# Patient Record
Sex: Female | Born: 1975 | Race: White | Hispanic: No | Marital: Married | Smoking: Never smoker
Health system: Southern US, Community
[De-identification: ages and names within clinical notes are randomized; demographics above are authoritative.]

---

## 1997-09-30 ENCOUNTER — Ambulatory Visit (HOSPITAL_COMMUNITY): Admission: RE | Admit: 1997-09-30 | Discharge: 1997-09-30 | Payer: Self-pay | Admitting: Obstetrics

## 1997-09-30 ENCOUNTER — Other Ambulatory Visit: Admission: RE | Admit: 1997-09-30 | Discharge: 1997-09-30 | Payer: Self-pay | Admitting: Obstetrics

## 1997-12-24 ENCOUNTER — Inpatient Hospital Stay (HOSPITAL_COMMUNITY): Admission: AD | Admit: 1997-12-24 | Discharge: 1997-12-24 | Payer: Self-pay | Admitting: Obstetrics

## 1998-01-06 ENCOUNTER — Ambulatory Visit (HOSPITAL_COMMUNITY): Admission: RE | Admit: 1998-01-06 | Discharge: 1998-01-06 | Payer: Self-pay | Admitting: Obstetrics

## 1998-04-01 ENCOUNTER — Inpatient Hospital Stay (HOSPITAL_COMMUNITY): Admission: AD | Admit: 1998-04-01 | Discharge: 1998-04-01 | Payer: Self-pay | Admitting: Obstetrics

## 1998-04-10 ENCOUNTER — Inpatient Hospital Stay (HOSPITAL_COMMUNITY): Admission: AD | Admit: 1998-04-10 | Discharge: 1998-04-12 | Payer: Self-pay | Admitting: Obstetrics

## 2005-01-16 ENCOUNTER — Emergency Department (HOSPITAL_COMMUNITY): Admission: EM | Admit: 2005-01-16 | Discharge: 2005-01-16 | Payer: Self-pay | Admitting: Emergency Medicine

## 2005-03-17 ENCOUNTER — Encounter: Payer: Self-pay | Admitting: Obstetrics and Gynecology

## 2005-03-18 ENCOUNTER — Emergency Department (HOSPITAL_COMMUNITY): Admission: EM | Admit: 2005-03-18 | Discharge: 2005-03-19 | Payer: Self-pay | Admitting: Emergency Medicine

## 2005-03-22 ENCOUNTER — Inpatient Hospital Stay (HOSPITAL_COMMUNITY): Admission: AD | Admit: 2005-03-22 | Discharge: 2005-03-22 | Payer: Self-pay | Admitting: *Deleted

## 2005-05-24 ENCOUNTER — Ambulatory Visit (HOSPITAL_COMMUNITY): Admission: RE | Admit: 2005-05-24 | Discharge: 2005-05-24 | Payer: Self-pay | Admitting: Obstetrics

## 2005-08-07 ENCOUNTER — Inpatient Hospital Stay (HOSPITAL_COMMUNITY): Admission: AD | Admit: 2005-08-07 | Discharge: 2005-08-08 | Payer: Self-pay | Admitting: Obstetrics

## 2005-08-08 ENCOUNTER — Inpatient Hospital Stay (HOSPITAL_COMMUNITY): Admission: AD | Admit: 2005-08-08 | Discharge: 2005-08-08 | Payer: Self-pay | Admitting: Obstetrics & Gynecology

## 2005-08-31 ENCOUNTER — Inpatient Hospital Stay (HOSPITAL_COMMUNITY): Admission: AD | Admit: 2005-08-31 | Discharge: 2005-08-31 | Payer: Self-pay | Admitting: Obstetrics & Gynecology

## 2005-09-13 ENCOUNTER — Observation Stay (HOSPITAL_COMMUNITY): Admission: AD | Admit: 2005-09-13 | Discharge: 2005-09-14 | Payer: Self-pay | Admitting: Obstetrics

## 2005-09-28 ENCOUNTER — Inpatient Hospital Stay (HOSPITAL_COMMUNITY): Admission: AD | Admit: 2005-09-28 | Discharge: 2005-09-29 | Payer: Self-pay | Admitting: Obstetrics

## 2005-10-06 ENCOUNTER — Inpatient Hospital Stay (HOSPITAL_COMMUNITY): Admission: AD | Admit: 2005-10-06 | Discharge: 2005-10-10 | Payer: Self-pay | Admitting: Obstetrics

## 2008-02-25 IMAGING — US US OB FOLLOW-UP
1 series · 13 of 28 positions shown · non-contrast
Comparison: none

CLINICAL DATA: 29 weeks pregnant.  Preterm labor.

[Series 1: us ob follow-up · 0.39mm/px · 13 of 40 slices shown]
[im 2/40]
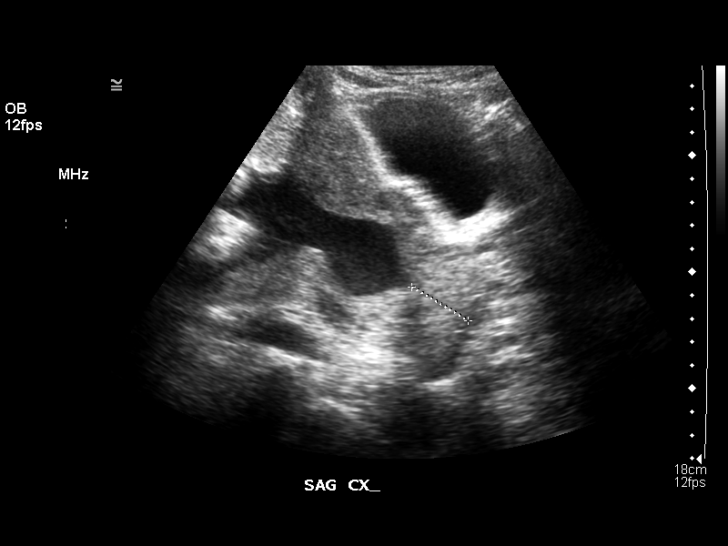
[im 5/40]
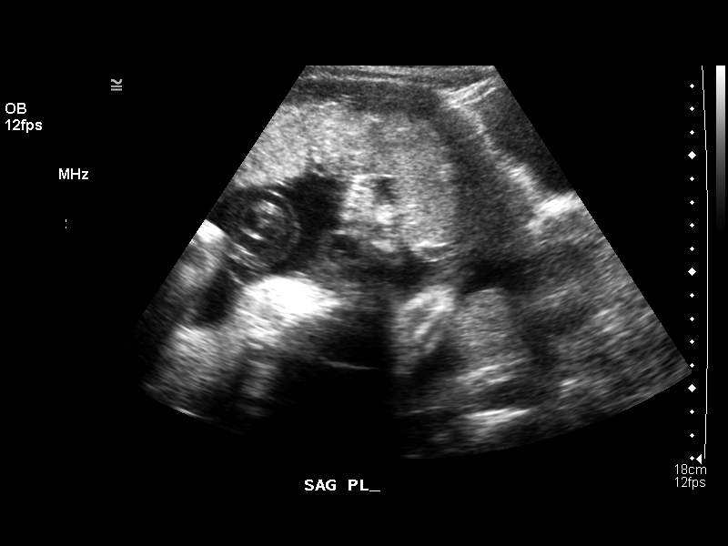
[im 8/40]
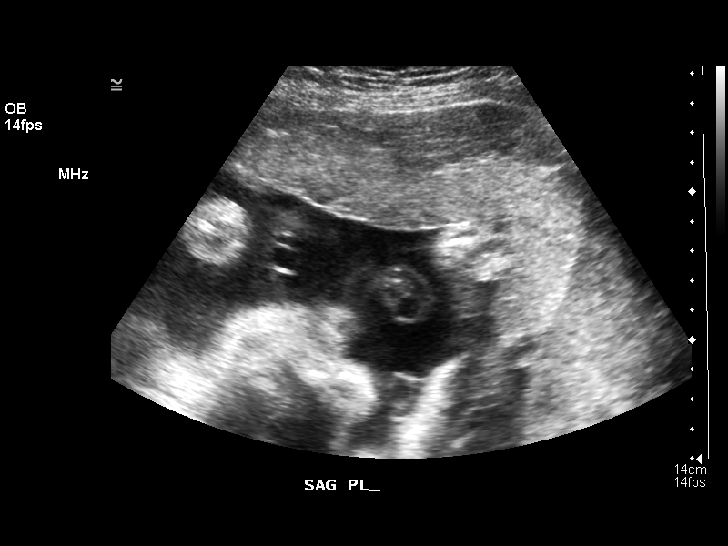
[im 11/40]
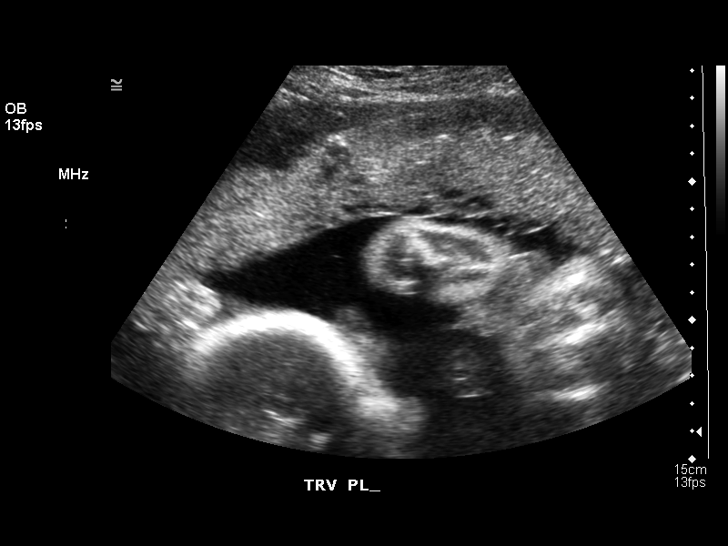
[im 14/40]
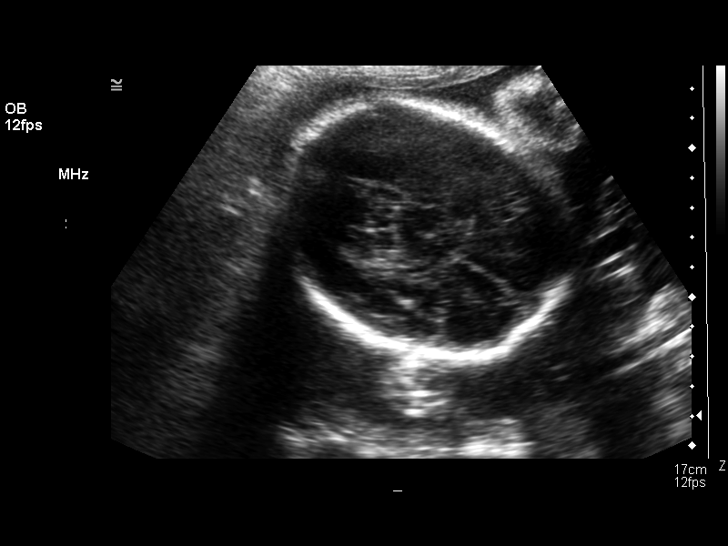
[im 16/40]
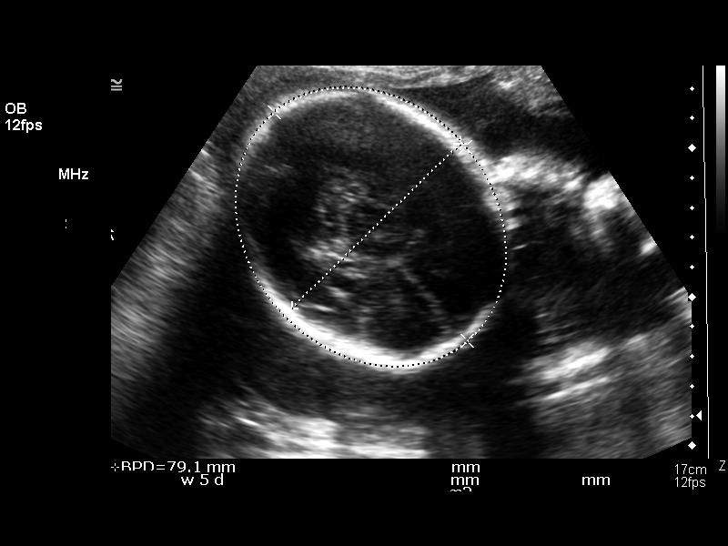
[im 21/40]
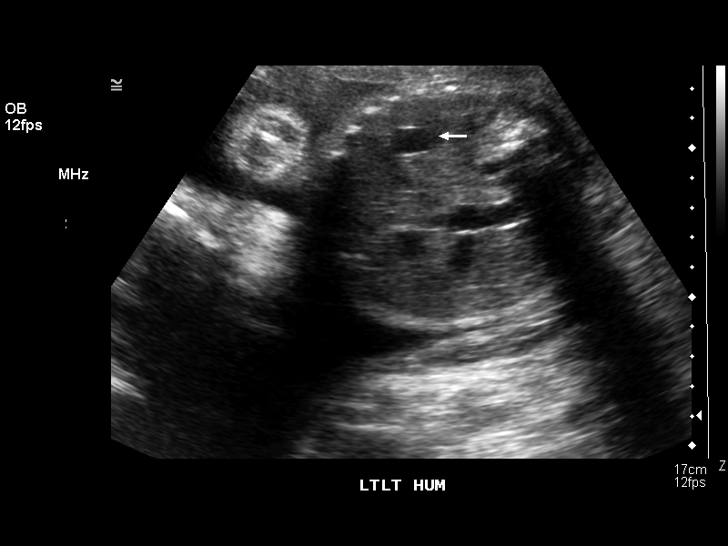
[im 24/40]
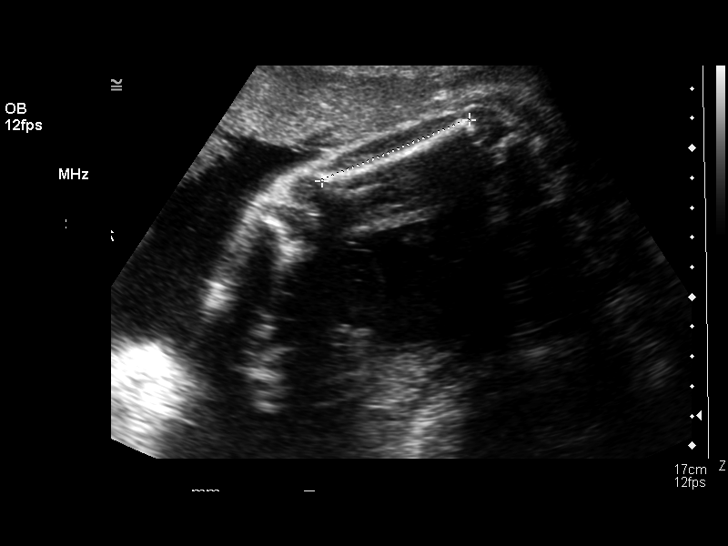
[im 27/40]
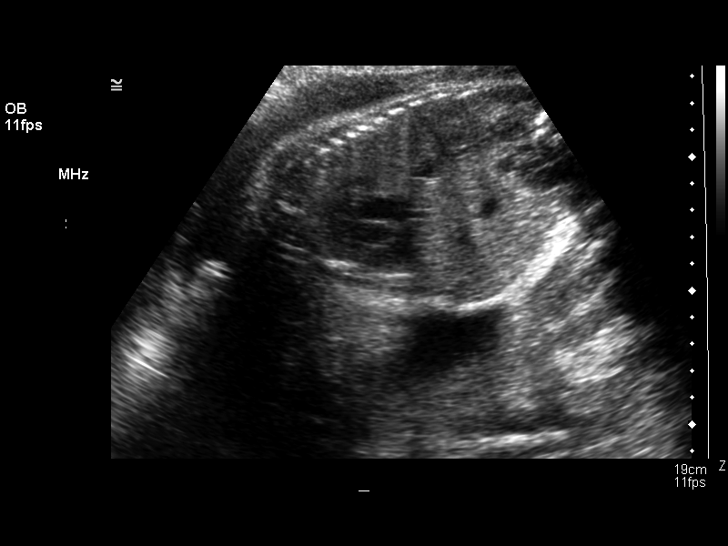
[im 29/40]
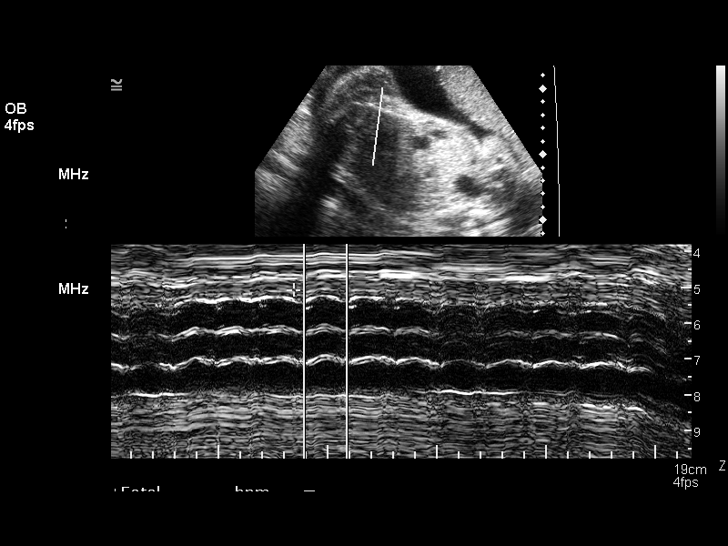
[im 32/40]
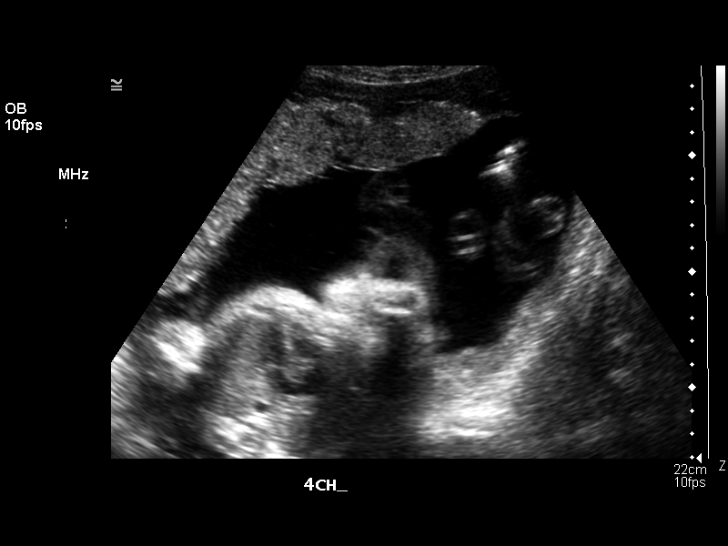
[im 35/40]
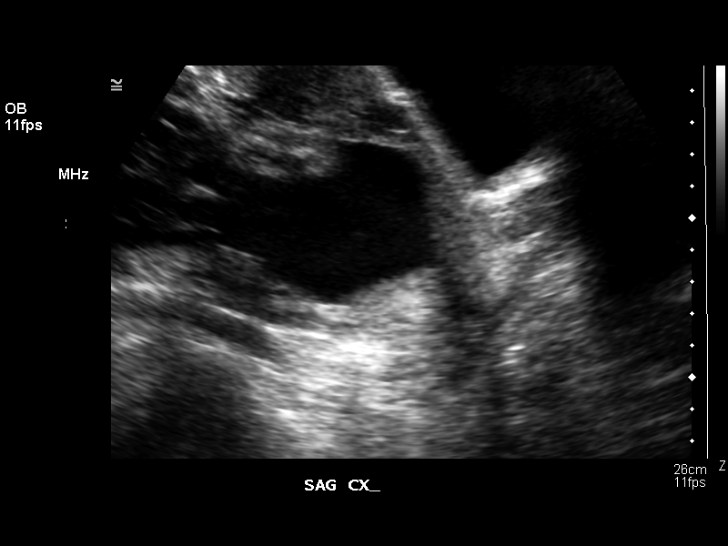
[im 38/40]
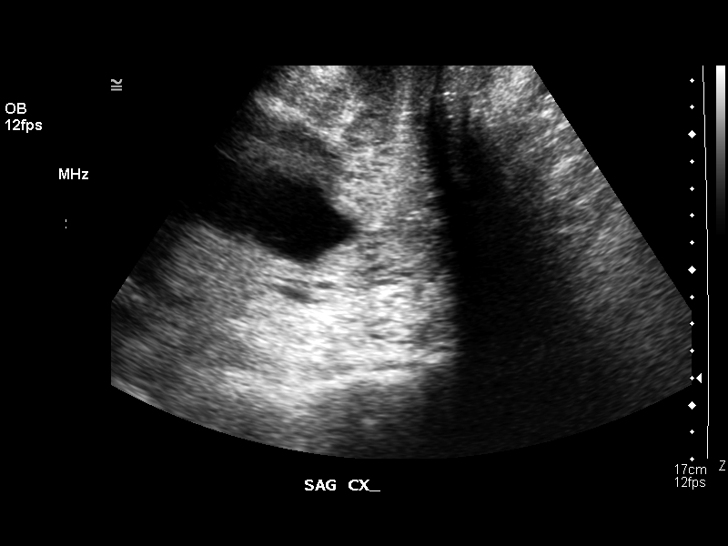

[13 of 28 positions shown; findings below may reference images not displayed]

OBSTETRICAL ULTRASOUND RE-EVALUATION:
 Number of Fetuses: 1
 Heart Rate: 153
 Movement:  Yes
 Breathing:  No
 Presentation:  Oblique/breech
 Placental Location:  Anterior
 Grade:  I
 Previa:  No
 Amniotic Fluid (subjective):  Normal
 Amniotic Fluid (objective):  6.1 cm Vertical pocket 

 FETAL BIOMETRY
 BPD:  7.9 cm  31 w 4 d
 HC:  28.8 cm  31 w 4 d
 AC:  26.8 cm  30 w 6 d
 FL:   5.5 cm  29 w 0 d 

 Mean GA:  30 w 5 d  US EDC:  10/11/05
 Assigned GA:  29 w 2 d  Assigned EDC:  10/21/05

 EFW:  3613 g (H) 90th – 95th%ile (6141 – 9887 g) For 29 wks 

 FETAL ANATOMY
 Lateral Ventricles:  Visualized 
 Thalami/CSP:  Visualized 
 Posterior Fossa:  Previously seen 
 Nuchal Region:  Previously seen 
 Spine:  Previously seen 
 4 Chamber Heart on Left:  Visualized 
 Stomach on Left:  Visualized 
 3 Vessel Cord:  Visualized 
 Cord Insertion Site:  Visualized 
 Kidneys:  Visualized 
 Bladder:  Visualized 
 Extremities:  Previously seen 

 ADDITIONAL ANATOMY VISUALIZED:  Diaphragm and male genitalia.

 Evaluation limited by: Fetal position and advanced gestational age.

 MATERNAL UTERINE AND ADNEXAL FINDINGS
 Cervix: 2.8 cm translabially
IMPRESSION: 1.  Single intrauterine pregnancy.  Fetal heart rate 153 bpm.  
 2.  Amniotic fluid subjectively and objectively within normal limits.
 3.  Estimated gestational age is currently 30 weeks 5 days.  Gestational age by first ultrasound would be 29 weeks 2 days.  
 4.  Cervix 2.8 cm, closed.

## 2008-04-02 IMAGING — US US OB LIMITED
1 series · 14 of 21 positions shown · non-contrast
Comparison: none

CLINICAL DATA: 35 weeks pregnant, contractions.

[Series 1: us ob limited · 0.39mm/px · 14 of 21 slices shown]
[im 1/21]
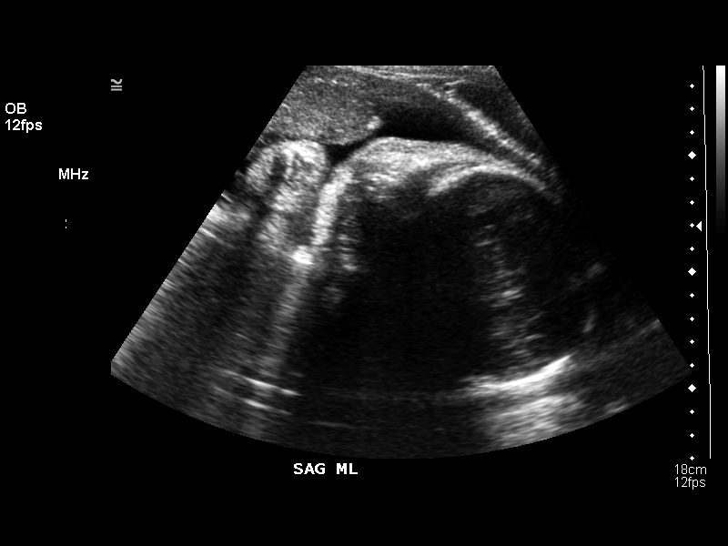
[im 3/21]
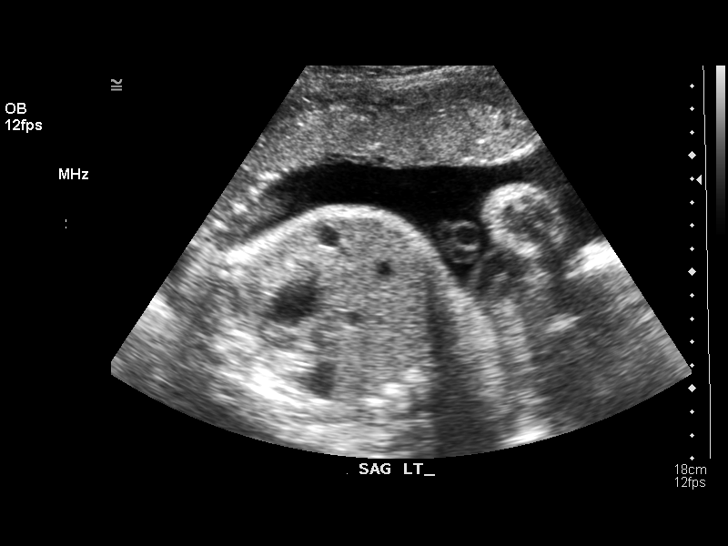
[im 4/21]
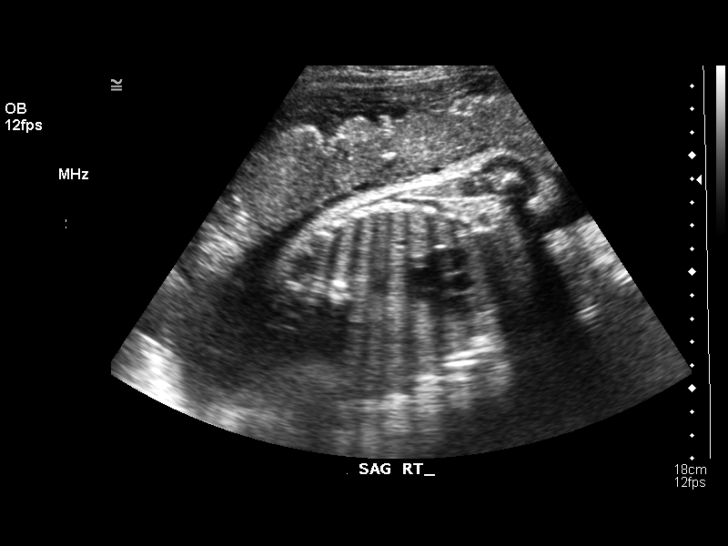
[im 6/21]
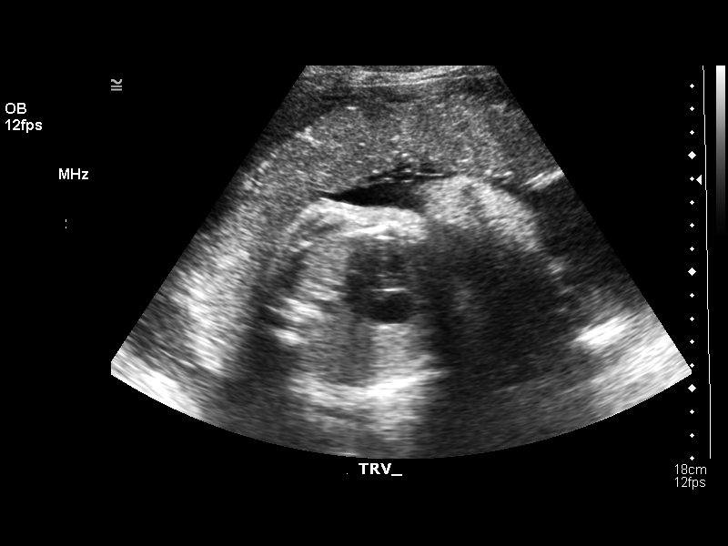
[im 7/21]
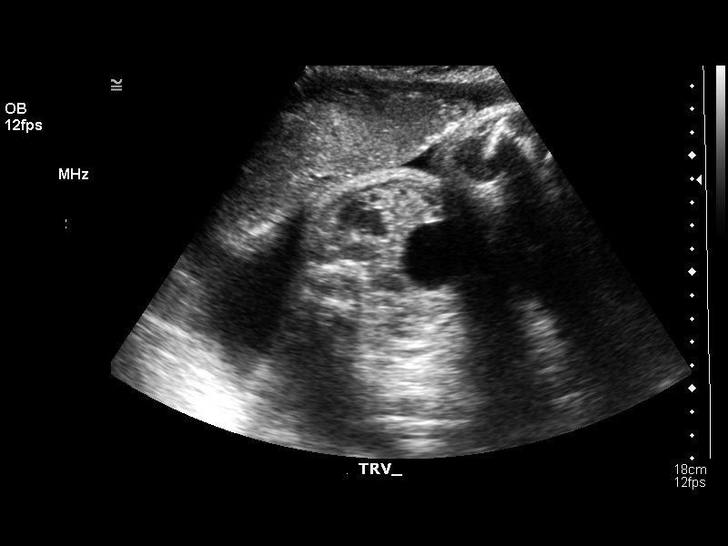
[im 9/21]
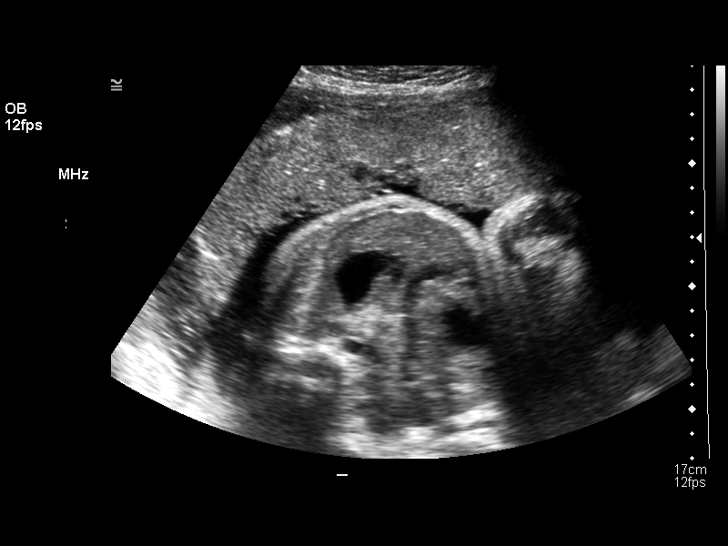
[im 10/21]
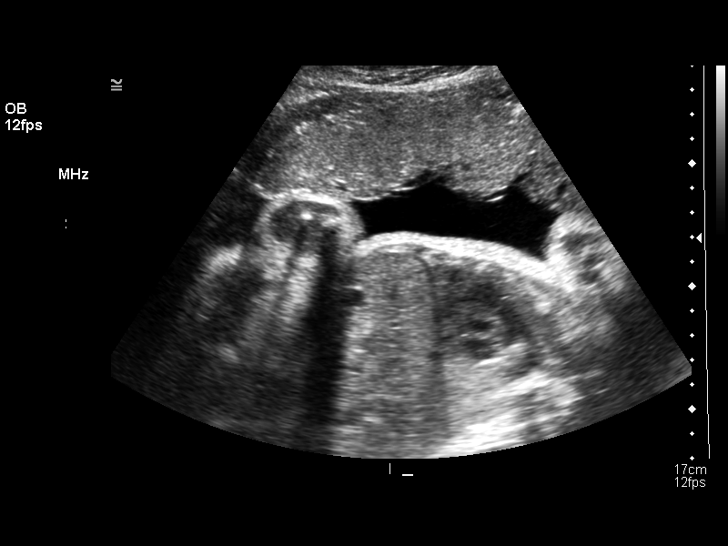
[im 12/21]
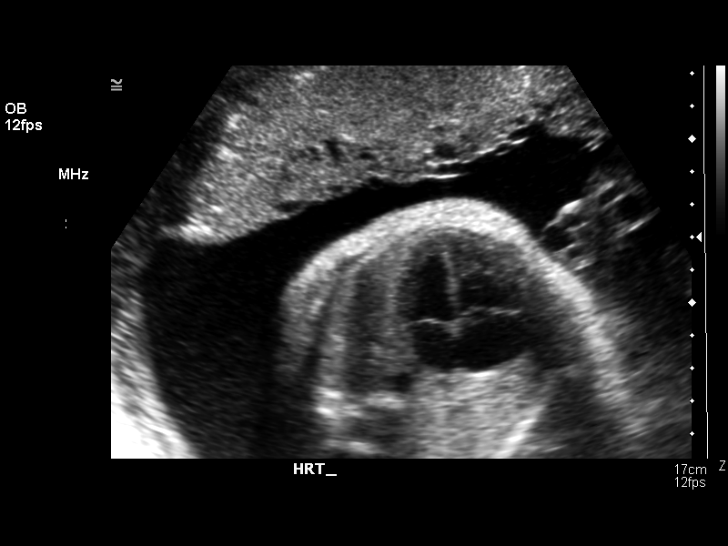
[im 13/21]
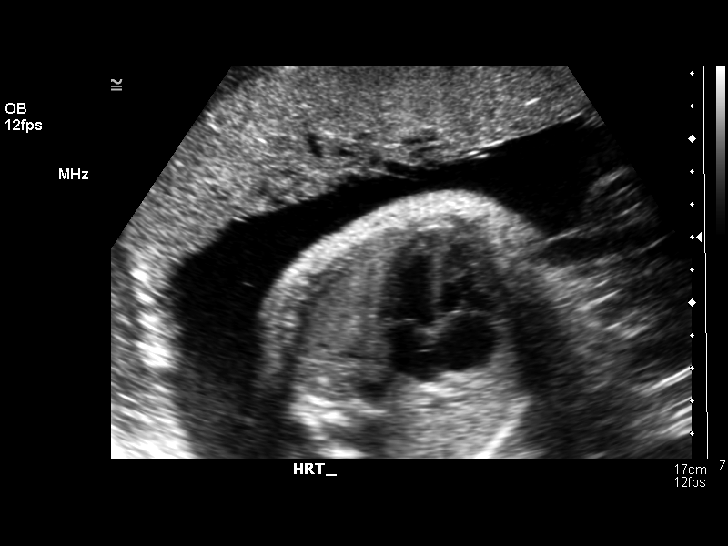
[im 15/21]
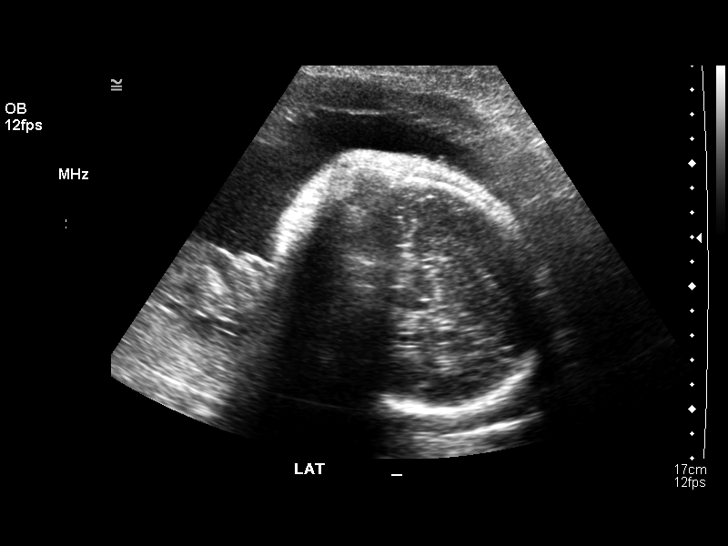
[im 16/21]
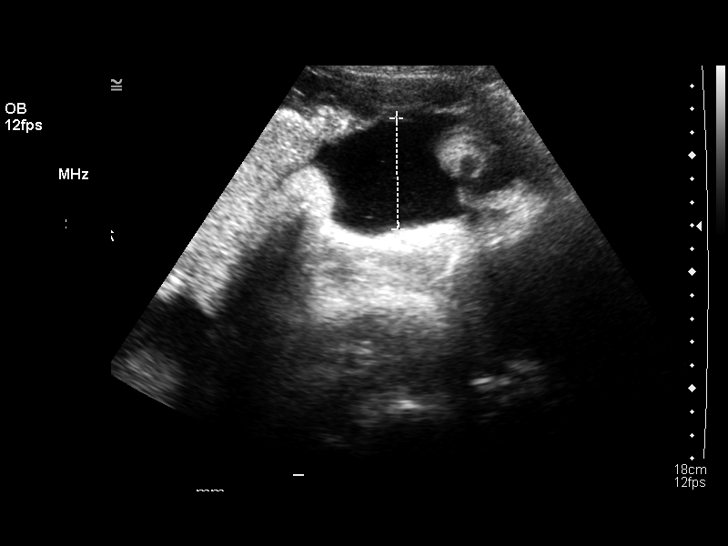
[im 18/21]
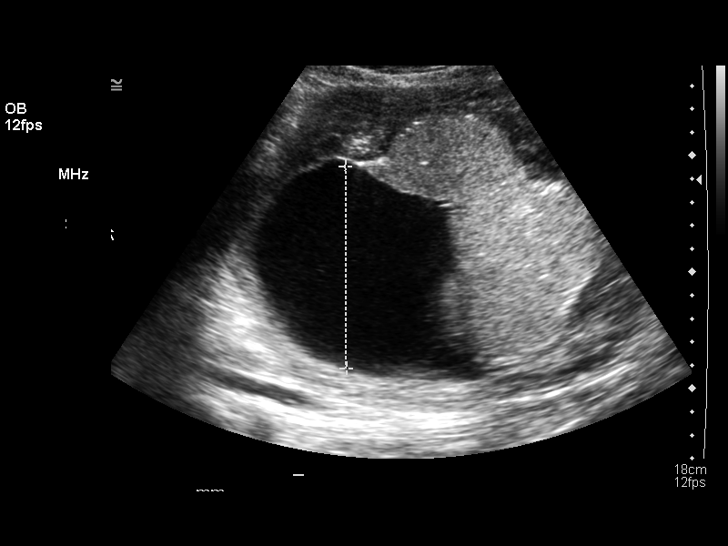
[im 19/21]
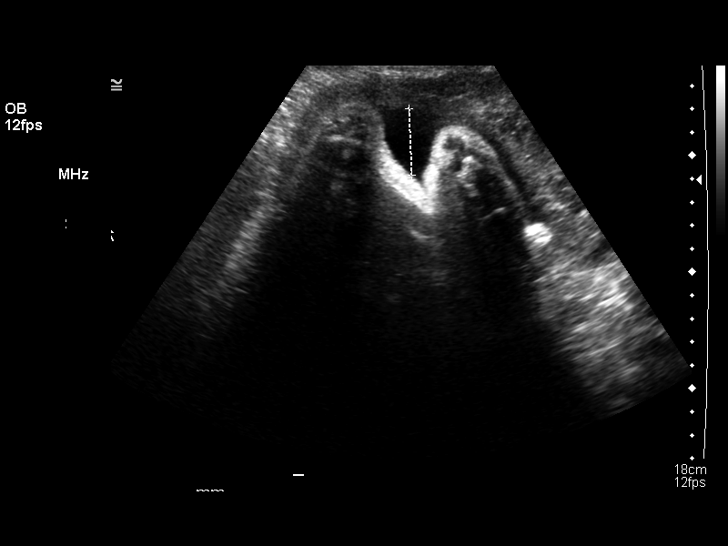
[im 21/21]
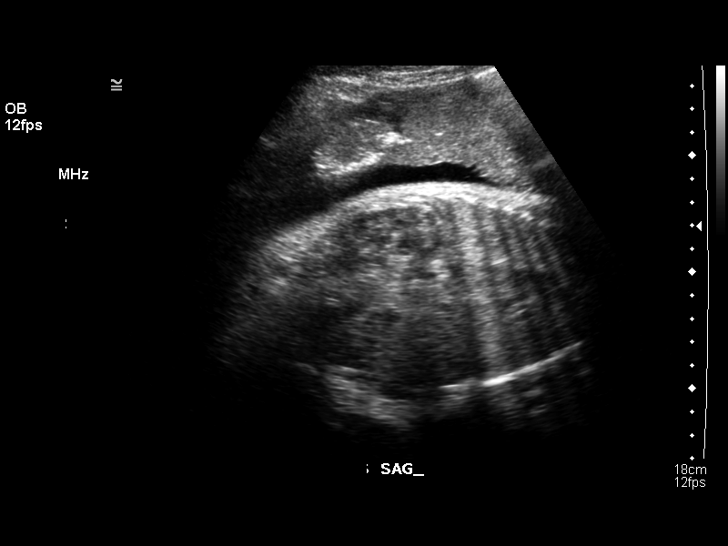

[14 of 21 positions shown; findings below may reference images not displayed]

LIMITED OBSTETRICAL ULTRASOUND:
 Number of Fetuses: 1
 Heart Rate:  150 bpm
 Movement:  Yes
 Breathing:  Yes
 Presentation:  Cephalic
 Placental Location:  Anterior
 Grade:  II
 Previa:  No
 Amniotic Fluid (Subjective):  Normal
 Amniotic Fluid (Objective):  AFI 21.2 cm (6th-96th %ile = 7.9 to 24.9 cm for 35 weeks) 

 Fetal measurements and complete anatomic evaluation were not requested.  The following fetal anatomy was visualized during this exam:  Lateral ventricles, 4-chamber heart, stomach, kidneys, bladder, and diaphragm.

 BIOPHYSICAL PROFILE:

 Movement:    2  Time:  15 minutes
 Breathing:  2
 Tone:  2
 Amniotic Fluid:  2

 Total Score:  8

 MATERNAL UTERINE AND ADNEXAL FINDINGS
 Cervix:  Not evaluated.
IMPRESSION: Single live intrauterine gestation in cephalic presentation with BPP [DATE].

## 2008-04-26 IMAGING — CR DG CHEST 1V PORT
1 series · 1 of 1 positions shown · non-contrast
Comparison: none

CLINICAL DATA: 38 weeks post partum C-section.  Rule out aspiration.
 PORTABLE CHEST - 1 VIEW:

[view not recorded]
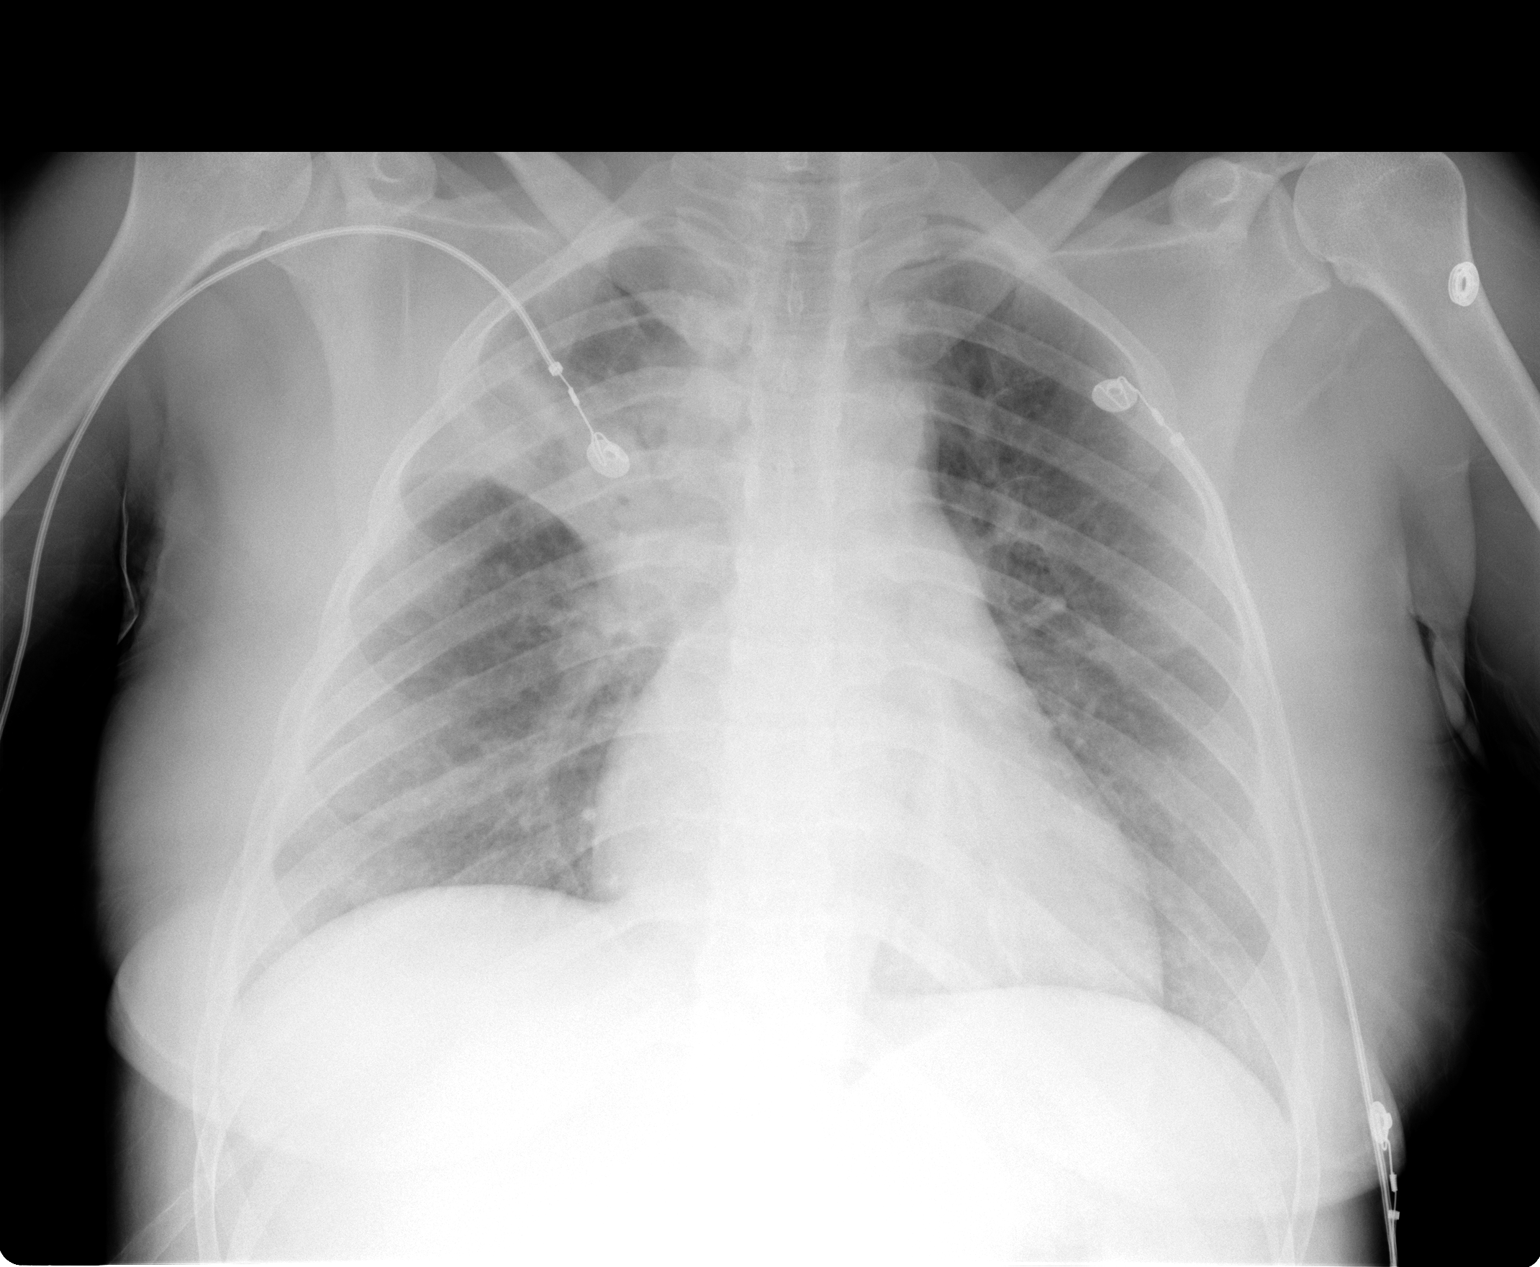

[1 of 1 positions shown; findings below may reference images not displayed]

FINDINGS: Heart size is within normal limits.  The lung fields are notable for partial volume loss in the right upper lobe.  There is some focal alveolar density identified in the right upper lung zone which may be an excess of that anticipated due to atelectasis alone and the possibility of underlying pneumonitis is not excluded.  Given the clinical question of aspiration, the partial volume loss in the right upper lobe raises the possibility of obstruction of the right upper lobe bronchus which could be due to aspirated material.  Clinical correlation is recommended.  The remainder of the lung fields are clear.  Bony structures are intact.
IMPRESSION: Partial volume loss in the right upper lobe with the possibility of right upper lobe pneumonitis not excluded.  Please see above report for full discussion.

## 2019-06-04 ENCOUNTER — Other Ambulatory Visit: Payer: Self-pay

## 2019-06-04 ENCOUNTER — Observation Stay (HOSPITAL_COMMUNITY)
Admission: EM | Admit: 2019-06-04 | Discharge: 2019-06-05 | Disposition: A | Discharge status: Home / Self Care | Payer: BC Managed Care – PPO | Attending: General Surgery | Admitting: General Surgery

## 2019-06-04 ENCOUNTER — Emergency Department (HOSPITAL_COMMUNITY): Payer: BC Managed Care – PPO

## 2019-06-04 ENCOUNTER — Encounter (HOSPITAL_COMMUNITY): Payer: Self-pay | Admitting: Emergency Medicine

## 2019-06-04 DIAGNOSIS — E669 Obesity, unspecified: Secondary | ICD-10-CM | POA: Diagnosis not present

## 2019-06-04 DIAGNOSIS — N189 Chronic kidney disease, unspecified: Secondary | ICD-10-CM | POA: Insufficient documentation

## 2019-06-04 DIAGNOSIS — E039 Hypothyroidism, unspecified: Secondary | ICD-10-CM | POA: Insufficient documentation

## 2019-06-04 DIAGNOSIS — R1011 Right upper quadrant pain: Secondary | ICD-10-CM | POA: Diagnosis present

## 2019-06-04 DIAGNOSIS — K8020 Calculus of gallbladder without cholecystitis without obstruction: Secondary | ICD-10-CM

## 2019-06-04 DIAGNOSIS — Z6833 Body mass index (BMI) 33.0-33.9, adult: Secondary | ICD-10-CM | POA: Insufficient documentation

## 2019-06-04 DIAGNOSIS — K8010 Calculus of gallbladder with chronic cholecystitis without obstruction: Principal | ICD-10-CM | POA: Insufficient documentation

## 2019-06-04 DIAGNOSIS — K8050 Calculus of bile duct without cholangitis or cholecystitis without obstruction: Secondary | ICD-10-CM | POA: Diagnosis present

## 2019-06-04 DIAGNOSIS — Z20822 Contact with and (suspected) exposure to covid-19: Secondary | ICD-10-CM | POA: Insufficient documentation

## 2019-06-04 LAB — COMPREHENSIVE METABOLIC PANEL
ALT: 27 U/L (ref 0–44)
AST: 21 U/L (ref 15–41)
Albumin: 4.1 g/dL (ref 3.5–5.0)
Alkaline Phosphatase: 67 U/L (ref 38–126)
Anion gap: 10 (ref 5–15)
BUN: 19 mg/dL (ref 6–20)
CO2: 25 mmol/L (ref 22–32)
Calcium: 9.6 mg/dL (ref 8.9–10.3)
Chloride: 104 mmol/L (ref 98–111)
Creatinine, Ser: 1.26 mg/dL — ABNORMAL HIGH (ref 0.44–1.00)
GFR calc Af Amer: >60 mL/min (ref >60)
GFR calc non Af Amer: 52 mL/min — ABNORMAL LOW (ref >60)
Glucose, Bld: 130 mg/dL — ABNORMAL HIGH (ref 70–99)
Potassium: 4 mmol/L (ref 3.5–5.1)
Sodium: 139 mmol/L (ref 135–145)
Total Bilirubin: 0.4 mg/dL (ref 0.3–1.2)
Total Protein: 7.3 g/dL (ref 6.5–8.1)

## 2019-06-04 LAB — TSH: TSH: 58.889 u[IU]/mL — ABNORMAL HIGH (ref 0.350–4.500)

## 2019-06-04 LAB — MRSA PCR SCREENING: MRSA by PCR: NEGATIVE

## 2019-06-04 LAB — LIPASE, BLOOD: Lipase: 42 U/L (ref 11–51)

## 2019-06-04 LAB — HIV ANTIBODY (ROUTINE TESTING W REFLEX): HIV Screen 4th Generation wRfx: NONREACTIVE

## 2019-06-04 LAB — CBC
HCT: 41.4 % (ref 36.0–46.0)
Hemoglobin: 13.5 g/dL (ref 12.0–15.0)
MCH: 29.2 pg (ref 26.0–34.0)
MCHC: 32.6 g/dL (ref 30.0–36.0)
MCV: 89.6 fL (ref 80.0–100.0)
Platelets: 249 10*3/uL (ref 150–400)
RBC: 4.62 MIL/uL (ref 3.87–5.11)
RDW: 13.3 % (ref 11.5–15.5)
WBC: 6.5 10*3/uL (ref 4.0–10.5)
nRBC: 0 % (ref 0.0–0.2)

## 2019-06-04 LAB — PROTIME-INR
INR: 1 (ref 0.8–1.2)
Prothrombin Time: 12.5 seconds (ref 11.4–15.2)

## 2019-06-04 LAB — I-STAT BETA HCG BLOOD, ED (MC, WL, AP ONLY): I-stat hCG, quantitative: <5 m[IU]/mL (ref <5)

## 2019-06-04 LAB — T4, FREE: Free T4: 0.36 ng/dL — ABNORMAL LOW (ref 0.61–1.12)

## 2019-06-04 LAB — SARS CORONAVIRUS 2 BY RT PCR (HOSPITAL ORDER, PERFORMED IN ~~LOC~~ HOSPITAL LAB): SARS Coronavirus 2: NEGATIVE

## 2019-06-04 MED ORDER — METHOCARBAMOL 1000 MG/10ML IJ SOLN
1000.0000 mg | Freq: Three times a day (TID) | INTRAVENOUS | Status: DC
  Administered 2019-06-04 – 2019-06-05 (×3): 1000 mg via INTRAVENOUS
  Filled 2019-06-04 (×8): qty 10

## 2019-06-04 MED ORDER — FENTANYL CITRATE (PF) 100 MCG/2ML IJ SOLN
50.0000 ug | Freq: Once | INTRAMUSCULAR | Status: AC
  Administered 2019-06-04: 50 ug via INTRAVENOUS
  Filled 2019-06-04: qty 2

## 2019-06-04 MED ORDER — SODIUM CHLORIDE 0.9% FLUSH
3.0000 mL | Freq: Once | INTRAVENOUS | Status: AC
  Administered 2019-06-04: 3 mL via INTRAVENOUS

## 2019-06-04 MED ORDER — HYDROMORPHONE HCL 1 MG/ML IJ SOLN
1.0000 mg | Freq: Once | INTRAMUSCULAR | Status: AC
  Administered 2019-06-04: 1 mg via INTRAVENOUS
  Filled 2019-06-04: qty 1

## 2019-06-04 MED ORDER — ONDANSETRON HCL 4 MG/2ML IJ SOLN
4.0000 mg | Freq: Once | INTRAMUSCULAR | Status: AC
Start: 2019-06-04 — End: 2019-06-04
  Administered 2019-06-04: 4 mg via INTRAVENOUS
  Filled 2019-06-04: qty 2

## 2019-06-04 MED ORDER — LACTATED RINGERS IV BOLUS
1000.0000 mL | Freq: Once | INTRAVENOUS | Status: AC
  Administered 2019-06-04: 1000 mL via INTRAVENOUS

## 2019-06-04 MED ORDER — LIDOCAINE-EPINEPHRINE 1 %-1:100000 IJ SOLN
INTRAMUSCULAR | Status: AC
  Filled 2019-06-04: qty 1

## 2019-06-04 MED ORDER — BUPIVACAINE HCL (PF) 0.25 % IJ SOLN
INTRAMUSCULAR | Status: AC
  Filled 2019-06-04: qty 30

## 2019-06-04 MED ORDER — OXYCODONE HCL 5 MG PO TABS
5.0000 mg | ORAL_TABLET | ORAL | Status: DC | PRN
  Administered 2019-06-04: 10 mg via ORAL
  Administered 2019-06-05: 5 mg via ORAL
  Filled 2019-06-04: qty 1
  Filled 2019-06-04: qty 2

## 2019-06-04 MED ORDER — HYDROMORPHONE HCL 1 MG/ML IJ SOLN
0.2500 mg | INTRAMUSCULAR | Status: DC | PRN
  Administered 2019-06-04 – 2019-06-05 (×3): 0.5 mg via INTRAVENOUS
  Filled 2019-06-04 (×3): qty 1

## 2019-06-04 MED ORDER — ENOXAPARIN SODIUM 40 MG/0.4ML ~~LOC~~ SOLN: 40.0000 mg | SUBCUTANEOUS | Status: DC

## 2019-06-04 MED ORDER — ACETAMINOPHEN 500 MG PO TABS
1000.0000 mg | ORAL_TABLET | Freq: Four times a day (QID) | ORAL | Status: DC
  Administered 2019-06-04 – 2019-06-05 (×3): 1000 mg via ORAL
  Filled 2019-06-04 (×4): qty 2

## 2019-06-04 MED ORDER — SODIUM CHLORIDE 0.9 % IV BOLUS
1000.0000 mL | Freq: Once | INTRAVENOUS | Status: AC
  Administered 2019-06-04: 1000 mL via INTRAVENOUS

## 2019-06-04 MED ORDER — CEFAZOLIN SODIUM-DEXTROSE 2-4 GM/100ML-% IV SOLN
2.0000 g | Freq: Once | INTRAVENOUS | Status: AC
  Administered 2019-06-05: 2 g via INTRAVENOUS
  Filled 2019-06-04: qty 100

## 2019-06-04 MED ORDER — ONDANSETRON HCL 4 MG/2ML IJ SOLN
4.0000 mg | Freq: Four times a day (QID) | INTRAMUSCULAR | Status: DC | PRN
  Administered 2019-06-04 – 2019-06-05 (×2): 4 mg via INTRAVENOUS
  Filled 2019-06-04 (×2): qty 2

## 2019-06-04 NOTE — ED Triage Notes (Signed)
Pt in w/RUQ pain, radiates to back. States she was asleep, and sharp, constant pain woke her out of sleep at midnight. Emesis x 1, hx of gallstones.

## 2019-06-04 NOTE — H&P (Signed)
Reason for Consult/Chief Complaint: abdominal pain Consultant: Olevia Bowens, [place]  Lindsay Lewis is an 44 y.o. female.   HPI: 2F with acute onset of abdominal pain that began approximately 12 hours ago, associated with non-bloody, non-bilious emesis. She denies every having had pain like this before. She localizes the pain to the epigastrium with radiation to the back. She reports normal bowel movements, most recently on 06/02/2019. Last meal 1800 on 06/03/2019, had hot wings and chocolate milk. She tried ibuprofen at home without relief. No alleviating factors. She reports a prior appendectomy approximately 17y ago.    History reviewed. No pertinent past medical history. Thyroid disease, non-adherent to thyroid supplementation, last taken ~1 month ago. History of renal disease and liver disease. Denies having a nephrologist or gastroenterologist/hepatologist. Unknown baseline creatinine. Unknown etiology of "liver problems".  History reviewed. No pertinent surgical history. Appy 17y ago  No family history on file.  Social History:  reports that she has never smoked. She has never used smokeless tobacco. She reports that she does not drink alcohol or use drugs. 1/2ppd, stopped six months ago, rare EtOH, no recreational drug use  Allergies: No Known Allergies  Medications: I have reviewed the patient's current medications.  Results for orders placed or performed during the hospital encounter of 06/04/19 (from the past 48 hour(s))  Lipase, blood     Status: None   Collection Time: 06/04/19  4:50 AM  Result Value Ref Range   Lipase 42 11 - 51 U/L    Comment: Performed at [facility], [address] [place], [place] [address]  Comprehensive metabolic panel     Status: Abnormal   Collection Time: 06/04/19  4:50 AM  Result Value Ref Range   Sodium 139 135 - 145 mmol/L   Potassium 4.0 3.5 - 5.1 mmol/L   Chloride 104 98 - 111 mmol/L   CO2 25 22 - 32 mmol/L   Glucose, Bld 130 (H) 70 -  99 mg/dL    Comment: Glucose reference range applies only to samples taken after fasting for at least 8 hours.   BUN 19 6 - 20 mg/dL   Creatinine, Ser 9.35 (H) 0.44 - 1.00 mg/dL   Calcium 9.6 8.9 - 70.1 mg/dL   Total Protein 7.3 6.5 - 8.1 g/dL   Albumin 4.1 3.5 - 5.0 g/dL   AST 21 15 - 41 U/L   ALT 27 0 - 44 U/L   Alkaline Phosphatase 67 38 - 126 U/L   Total Bilirubin 0.4 0.3 - 1.2 mg/dL   GFR calc non Af Amer 52 (L) >60 mL/min   GFR calc Af Amer >60 >60 mL/min   Anion gap 10 5 - 15    Comment: Performed at [facility], [address] [place], [place] [address]  CBC     Status: None   Collection Time: 06/04/19  4:50 AM  Result Value Ref Range   WBC 6.5 4.0 - 10.5 K/uL   RBC 4.62 3.87 - 5.11 MIL/uL   Hemoglobin 13.5 12.0 - 15.0 g/dL   HCT 03.0 09.2 - 33.0 %   MCV 89.6 80.0 - 100.0 fL   MCH 29.2 26.0 - 34.0 pg   MCHC 32.6 30.0 - 36.0 g/dL   RDW 07.6 22.6 - 33.3 %   Platelets 249 150 - 400 K/uL   nRBC 0.0 0.0 - 0.2 %    Comment: Performed at [facility], [address] [place], [place] [address]  I-Stat beta hCG blood, ED     Status: None   Collection Time: 06/04/19  7:17 AM  Result Value Ref Range   I-stat hCG, quantitative <5.0 <5 mIU/mL   Comment 3            Comment:   GEST. AGE      CONC.  (mIU/mL)   <=1 WEEK        5 - 50     2 WEEKS       50 - 500     3 WEEKS       100 - 10,000     4 WEEKS     1,000 - 30,000        FEMALE AND NON-PREGNANT FEMALE:     LESS THAN 5 mIU/mL     US Abdomen Limited RUQ  Result Date: 06/04/2019 CLINICAL DATA:  Right upper quadrant abdominal pain. Additional history provided by scanning technologist: Right upper quadrant pain since last night. EXAM: ULTRASOUND ABDOMEN LIMITED RIGHT UPPER QUADRANT COMPARISON:  Abdominal ultrasound 01/16/2016. FINDINGS: Gallbladder: Cholecystolithiasis. This includes a 1.2 cm calculus within the gallbladder neck. No gallbladder wall thickening. No sonographic Murphy sign noted by sonographer.  Common bile duct: Diameter: 3-4 mm, within normal limits. Liver: No focal lesion identified. Generalized increased hepatic parenchymal echogenicity. Portal vein is patent on color Doppler imaging with normal direction of blood flow towards the liver. IMPRESSION: Cholecystolithiasis including a 1.2 cm calculus within the gallbladder neck. No sonographic evidence of acute cholecystitis. The visualized common duct is normal in caliber Hyperechogenicity of the hepatic parenchyma. This is a nonspecific finding, which may be seen in the setting of hepatic steatosis or other chronic hepatic parenchymal disease. Electronically Signed   By: Kellie Simmering DO   On: 06/04/2019 08:01    ROS 10 point review of systems is negative except as listed above in HPI.   Physical Exam Blood pressure 110/85, pulse 67, temperature 97.9 F (36.6 C), temperature source Oral, resp. rate 16, weight 89.4 kg, last menstrual period 06/03/2013, SpO2 94 %. Constitutional: well-developed, well-nourished,  HEENT: pupils equal, round, reactive to light, 87mm b/l, moist conjunctiva, external inspection of ears and nose normal, hearing intact Oropharynx: normal oropharyngeal mucosa, normal dentition Neck: no thyromegaly, trachea midline, no midline cervical tenderness to palpation Chest: breath sounds equal bilaterally, normal respiratory effort, no midline or lateral chest wall tenderness to palpation/deformity Abdomen: soft, NT, no bruising, no hepatosplenomegaly GU: normal female genitalia  Back: no wounds, no thoracic/lumbar spine tenderness to palpation, no thoracic/lumbar spine stepoffs Rectal: deferred Extremities: 2+ radial and pedal pulses bilaterally, motor and sensation intact to bilateral UE and LE, no peripheral edema MSK: normal gait/station, no clubbing/cyanosis of fingers/toes, normal ROM of all four extremities Skin: warm, dry, no rashes Psych: normal memory, normal mood/affect    Assessment/Plan: 21F with biliary  colic. No signs of cholecystitis on labs or imaging. We discussed increased per-operative risk due to poorly managed medical problems, however she does not have any cardiopulmonary risk factors. EKG reviewed. Will send INR in light of self-reported "liver problems" as well as TSH/T3/T4. Informed consent was obtained after detailed explanation of risks, including bleeding, infection, biloma, need for IOC to delineate anatomy, and need for conversion to open procedure. All questions answered to the patient's satisfaction, as well as her husband who is at bedside. Possible discharge post-op vs 5/18 pending surgical timing.    Jesusita Oka, MD General and [facility]

## 2019-06-04 NOTE — ED Provider Notes (Signed)
[facility] EMERGENCY DEPARTMENT Provider Note   CSN: [id number] Arrival date & time: 06/04/19  0440     History Chief Complaint  Patient presents with  . Abdominal Pain    Lindsay Lewis is a 44 y.o. female history gallstones and obesity.  Patient presents today for right upper quadrant pain onset midnight last night while sleeping describes severe sharp burning sensation constant radiating to her right side no alleviating factors, worsened with palpation.  Describes some similar pain in the past due to gallstones diagnosed at [facility].  Associated with nausea and one episode of nonbloody/nonbilious emesis earlier this morning.  Denies fever/chills, chest pain/shortness of breath, cough/hemoptysis, diarrhea, dysuria/hematuria, fall/injury or any additional concerns.  HPI     History reviewed. No pertinent past medical history.  Patient Active Problem List   Diagnosis Date Noted  . Biliary colic 06/04/2019    History reviewed. No pertinent surgical history.   OB History   No obstetric history on file.     No family history on file.  Social History   Tobacco Use  . Smoking status: Never Smoker  . Smokeless tobacco: Never Used  Substance Use Topics  . Alcohol use: Never  . Drug use: Never    Home Medications Prior to Admission medications   Medication Sig Start Date End Date Taking? Authorizing Provider  ibuprofen (ADVIL) 200 MG tablet Take 400 mg by mouth every 6 (six) hours as needed for moderate pain.   Yes [provider]    Allergies    Patient has no known allergies.  Review of Systems   Review of Systems Ten systems are reviewed and are negative for acute change except as noted in the HPI  Physical Exam Updated Vital Signs BP 110/85   Pulse 67   Temp 97.9 F (36.6 C) (Oral)   Resp 16   Wt 89.4 kg   LMP 06/03/2013   SpO2 94%   Physical Exam Constitutional:      General: She is not in acute distress.  Appearance: Normal appearance. She is well-developed. She is obese. She is not ill-appearing or diaphoretic.  HENT:     Head: Normocephalic and atraumatic.     Right Ear: External ear normal.     Left Ear: External ear normal.     Nose: Nose normal.  Eyes:     General: Vision grossly intact. Gaze aligned appropriately.     Pupils: Pupils are equal, round, and reactive to light.  Neck:     Trachea: Trachea and phonation normal. No tracheal deviation.  Pulmonary:     Effort: Pulmonary effort is normal. No respiratory distress.  Abdominal:     General: There is no distension.     Palpations: Abdomen is soft.     Tenderness: There is abdominal tenderness in the right upper quadrant. There is no guarding or rebound. Positive signs include Murphy's sign. Negative signs include McBurney's sign.  Musculoskeletal:        General: Normal range of motion.     Cervical back: Normal range of motion.  Skin:    General: Skin is warm and dry.  Neurological:     Mental Status: She is alert.     GCS: GCS eye subscore is 4. GCS verbal subscore is 5. GCS motor subscore is 6.     Comments: Speech is clear and goal oriented, follows commands Major Cranial nerves without deficit, no facial droop Moves extremities without ataxia, coordination intact  Psychiatric:  Behavior: Behavior normal.     ED Results / Procedures / Treatments   Labs (all labs ordered are listed, but only abnormal results are displayed) Labs Reviewed  COMPREHENSIVE METABOLIC PANEL - Abnormal; Notable for the following components:      Result Value   Glucose, Bld 130 (*)    Creatinine, Ser 1.26 (*)    GFR calc non Af Amer 52 (*)    All other components within normal limits  SARS CORONAVIRUS 2 BY RT PCR (HOSPITAL ORDER, PERFORMED IN [facility])  LIPASE, BLOOD  CBC  URINALYSIS, ROUTINE W REFLEX MICROSCOPIC  TSH  T3, FREE  T4, FREE  PROTIME-INR  HIV ANTIBODY (ROUTINE TESTING W REFLEX)  I-STAT BETA HCG  BLOOD, ED ([facility], [facility], [facility] ONLY)    EKG EKG Interpretation  Date/Time:  Monday Jun 04 2019 04:50:59 EDT Ventricular Rate:  74 PR Interval:  144 QRS Duration: 72 QT Interval:  398 QTC Calculation: 441 R Axis:   66 Text Interpretation: Normal sinus rhythm Normal ECG No previous ECGs available Confirmed by Glynn Octave [phone]) on 06/04/2019 6:24:32 AM   Radiology US Abdomen Limited RUQ  Result Date: 06/04/2019 CLINICAL DATA:  Right upper quadrant abdominal pain. Additional history provided by scanning technologist: Right upper quadrant pain since last night. EXAM: ULTRASOUND ABDOMEN LIMITED RIGHT UPPER QUADRANT COMPARISON:  Abdominal ultrasound 01/16/2016. FINDINGS: Gallbladder: Cholecystolithiasis. This includes a 1.2 cm calculus within the gallbladder neck. No gallbladder wall thickening. No sonographic Murphy sign noted by sonographer. Common bile duct: Diameter: 3-4 mm, within normal limits. Liver: No focal lesion identified. Generalized increased hepatic parenchymal echogenicity. Portal vein is patent on color Doppler imaging with normal direction of blood flow towards the liver. IMPRESSION: Cholecystolithiasis including a 1.2 cm calculus within the gallbladder neck. No sonographic evidence of acute cholecystitis. The visualized common duct is normal in caliber Hyperechogenicity of the hepatic parenchyma. This is a nonspecific finding, which may be seen in the setting of hepatic steatosis or other chronic hepatic parenchymal disease. Electronically Signed   By: Jackey Loge DO   On: 06/04/2019 08:01    Procedures Procedures (including critical care time)  Medications Ordered in ED Medications  ceFAZolin (ANCEF) IVPB 2g/100 mL premix (has no administration in time range)  enoxaparin (LOVENOX) injection 40 mg (has no administration in time range)  acetaminophen (TYLENOL) tablet 1,000 mg (has no administration in time range)  oxyCODONE (Oxy IR/ROXICODONE) immediate release tablet 5-10 mg  (has no administration in time range)  HYDROmorphone (DILAUDID) injection 0.25-0.5 mg (has no administration in time range)  ondansetron (ZOFRAN) injection 4 mg (has no administration in time range)  methocarbamol (ROBAXIN) 1,000 mg in dextrose 5 % 100 mL IVPB (has no administration in time range)  sodium chloride flush (NS) 0.9 % injection 3 mL (3 mLs Intravenous Given 06/04/19 1104)  fentaNYL (SUBLIMAZE) injection 50 mcg (50 mcg Intravenous Given 06/04/19 0702)  sodium chloride 0.9 % bolus 1,000 mL (0 mLs Intravenous Stopped 06/04/19 0938)  HYDROmorphone (DILAUDID) injection 1 mg (1 mg Intravenous Given 06/04/19 0845)  ondansetron (ZOFRAN) injection 4 mg (4 mg Intravenous Given 06/04/19 0845)  HYDROmorphone (DILAUDID) injection 1 mg (1 mg Intravenous Given 06/04/19 1103)  lactated ringers bolus 1,000 mL (1,000 mLs Intravenous New Bag/Given 06/04/19 1103)    ED Course  I have reviewed the triage vital signs and the nursing notes.  Pertinent labs & imaging results that were available during my care of the patient were reviewed by me and considered in  my medical decision making (see chart for details).  Clinical Course as of Jun 03 1157  Mon Jun 04, 2019  1026 Kathlee Nations   [BM]    Clinical Course User Index [BM] Gari Crown   MDM Rules/Calculators/A&P                     Brief Summary: 44 year old female presents with pain similar to previous gallstone related pain.  1 episode of nonbloody/nonbilious emesis this morning.  Denies recent illness, no history of fever, no chest pain or shortness of breath or any additional concerns.  Uncomfortable on arrival holding her right upper quadrant.  Positive Murphy sign and right upper quadrant tenderness on exam.  Remainder of abdomen is soft and nontender.  Vital signs stable.  Will obtain abdominal pain blood work as well as right upper quadrant ultrasound.  Fentanyl ordered for pain.  Additional History Obtained: Nursing notes from this  visit. No prior EMR visits within the last 10 years. Husband at bedside corroborates patient story.  Lab Tests: I ordered, reviewed and interpreted labs which include: CBC shows no evidence of anemia and no leukocytosis to suggest infection. CMP show creatinine 1.26, no emergent electrolyte derangement or acute kidney injury or elevation of LFTs. Lipase within normal limits no evidence of pancreatitis. Pregnancy test negative.  Imaging Tests: I ordered imaging studies which include:  RUQ [place]:  IMPRESSION:  Cholecystolithiasis including a 1.2 cm calculus within the  gallbladder neck. No sonographic evidence of acute cholecystitis.  The visualized common duct is normal in caliber    Hyperechogenicity of the hepatic parenchyma. This is a nonspecific  finding, which may be seen in the setting of hepatic steatosis or  other chronic hepatic parenchymal disease.   EKG: Normal sinus rhythm Normal ECG No previous ECGs available Confirmed by Ezequiel Essex [phone]) on 06/04/2019 6:24:32 AM   ED Course: Vital signs and lab work reassuring today.  Patient had minimal improvement following fentanyl.  Subsequently she was given Dilaudid with Zofran, minimal improvement in symptoms again.  Patient is requesting to speak with general surgery for gallbladder removal, I feel this is reasonable as she is unable to tolerate p.o and having continued pain after multiple doses of narcotic pain medicine.  I then consulted general surgery and spoke with Kathlee Nations at 10:26 AM who will be coming down to see the patient.  - 11:56 AM: Patient reevaluated resting comfortably in bed no acute distress vital signs stable.  She is being admitted by general surgery for cholecystectomy.  No new complaints.   Note: Portions of this report may have been transcribed using voice recognition software. Every effort was made to ensure accuracy; however, inadvertent computerized transcription errors may still be present. Final  Clinical Impression(s) / ED Diagnoses Final diagnoses:  RUQ abdominal pain  Symptomatic cholelithiasis    Rx / DC Orders ED Discharge Orders    None       Gari Crown 06/04/19 1159    Sherwood Gambler, MD 06/05/19 [phone]

## 2019-06-04 NOTE — Discharge Instructions (Signed)
[facility] ° °Please arrive at least 30 min before your appointment to complete your check in paperwork.  If you are unable to arrive 30 min prior to your appointment time we may have to cancel or reschedule you. °LAPAROSCOPIC SURGERY: POST OP INSTRUCTIONS °Always review your discharge instruction sheet given to you by the facility where your surgery was performed. °IF YOU HAVE DISABILITY OR FAMILY LEAVE FORMS, YOU MUST BRING THEM TO THE OFFICE FOR PROCESSING.   °DO NOT GIVE THEM TO YOUR DOCTOR. ° °PAIN CONTROL ° °1. First take acetaminophen (Tylenol) AND/or ibuprofen (Advil) to control your pain after surgery.  Follow directions on package.  Taking acetaminophen (Tylenol) and/or ibuprofen (Advil) regularly after surgery will help to control your pain and lower the amount of prescription pain medication you may need.  You should not take more than 4,000 mg (4 grams) of acetaminophen (Tylenol) in 24 hours.  You should not take ibuprofen (Advil), aleve, motrin, naprosyn or other NSAIDS if you have a history of stomach ulcers or chronic kidney disease.  °2. A prescription for pain medication may be given to you upon discharge.  Take your pain medication as prescribed, if you still have uncontrolled pain after taking acetaminophen (Tylenol) or ibuprofen (Advil). °3. Use ice packs to help control pain. °4. If you need a refill on your pain medication, please contact your pharmacy.  They will contact our office to request authorization. Prescriptions will not be filled after 5pm or on week-ends. ° °HOME MEDICATIONS °5. Take your usually prescribed medications unless otherwise directed. ° °DIET °6. You should follow a light diet the first few days after arrival home.  Be sure to include lots of fluids daily. Avoid fatty, fried foods.  ° °CONSTIPATION °7. It is common to experience some constipation after surgery and if you are taking pain medication.  Increasing fluid intake and taking a stool  softener (such as Colace) will usually help or prevent this problem from occurring.  A mild laxative (Milk of Magnesia or Miralax) should be taken according to package instructions if there are no bowel movements after 48 hours. ° °WOUND/INCISION CARE °8. Most patients will experience some swelling and bruising in the area of the incisions.  Ice packs will help.  Swelling and bruising can take several days to resolve.  °9. Unless discharge instructions indicate otherwise, follow guidelines below  °a. STERI-STRIPS - you may remove your outer bandages 48 hours after surgery, and you may shower at that time.  You have steri-strips (small skin tapes) in place directly over the incision.  These strips should be left on the skin for 7-10 days.   °b. DERMABOND/SKIN GLUE - you may shower in 24 hours.  The glue will flake off over the next 2-3 weeks. °10. Any sutures or staples will be removed at the office during your follow-up visit. ° °ACTIVITIES °11. You may resume regular (light) daily activities beginning the next day--such as daily self-care, walking, climbing stairs--gradually increasing activities as tolerated.  You may have sexual intercourse when it is comfortable.  Refrain from any heavy lifting or straining until approved by your doctor. °a. You may drive when you are no longer taking prescription pain medication, you can comfortably wear a seatbelt, and you can safely maneuver your car and apply brakes. ° °FOLLOW-UP °12. You should see your doctor in the office for a follow-up appointment approximately 2-3 weeks after your surgery.  You should have been given your post-op/follow-up appointment when   your surgery was scheduled.  If you did not receive a post-op/follow-up appointment, make sure that you call for this appointment within a day or two after you arrive home to insure a convenient appointment time. ° °OTHER INSTRUCTIONS ° °WHEN TO CALL YOUR DOCTOR: °1. Fever over 101.0 °2. Inability to  urinate °3. Continued bleeding from incision. °4. Increased pain, redness, or drainage from the incision. °5. Increasing abdominal pain ° °The clinic staff is available to answer your questions during regular business hours.  Please don’t hesitate to call and ask to speak to one of the nurses for clinical concerns.  If you have a medical emergency, go to the nearest emergency room or call 911.  A surgeon from [facility] is always on call at the hospital. °[address], [address], [place], [place]  [address] ? [address] [address], [place], [place]   [address] °[phone] ? [phone] ? FAX [phone] ° ° ° °

## 2019-06-05 ENCOUNTER — Encounter (HOSPITAL_COMMUNITY): Payer: Self-pay

## 2019-06-05 ENCOUNTER — Encounter (HOSPITAL_COMMUNITY)
Admission: EM | Disposition: A | Discharge status: Home / Self Care | Payer: Self-pay | Source: Home / Self Care | Attending: Emergency Medicine

## 2019-06-05 ENCOUNTER — Observation Stay (HOSPITAL_COMMUNITY): Payer: BC Managed Care – PPO | Admitting: Anesthesiology

## 2019-06-05 HISTORY — PX: CHOLECYSTECTOMY: SHX55

## 2019-06-05 LAB — T3, FREE: T3, Free: 1.3 pg/mL — ABNORMAL LOW (ref 2.0–4.4)

## 2019-06-05 SURGERY — LAPAROSCOPIC CHOLECYSTECTOMY
Anesthesia: General | Site: Abdomen

## 2019-06-05 MED ORDER — BUPIVACAINE HCL (PF) 0.25 % IJ SOLN
INTRAMUSCULAR | Status: AC
  Filled 2019-06-05: qty 10

## 2019-06-05 MED ORDER — LEVOTHYROXINE SODIUM 100 MCG PO TABS: ORAL_TABLET | ORAL | 0 refills | Status: DC

## 2019-06-05 MED ORDER — LACTATED RINGERS IV SOLN: INTRAVENOUS | Status: DC

## 2019-06-05 MED ORDER — LIDOCAINE 2% (20 MG/ML) 5 ML SYRINGE
INTRAMUSCULAR | Status: AC
  Filled 2019-06-05: qty 5

## 2019-06-05 MED ORDER — METHOCARBAMOL 750 MG PO TABS: 750.0000 mg | ORAL_TABLET | Freq: Three times a day (TID) | ORAL | 0 refills | Status: DC | PRN

## 2019-06-05 MED ORDER — FENTANYL CITRATE (PF) 100 MCG/2ML IJ SOLN
25.0000 ug | INTRAMUSCULAR | Status: DC | PRN
Start: 2019-06-05 — End: 2019-06-05

## 2019-06-05 MED ORDER — STERILE WATER FOR IRRIGATION IR SOLN
Status: DC | PRN
  Administered 2019-06-05: 1000 mL

## 2019-06-05 MED ORDER — BUPIVACAINE HCL (PF) 0.25 % IJ SOLN
INTRAMUSCULAR | Status: AC
  Filled 2019-06-05: qty 20

## 2019-06-05 MED ORDER — MIDAZOLAM HCL 2 MG/2ML IJ SOLN
INTRAMUSCULAR | Status: DC | PRN
  Administered 2019-06-05: 2 mg via INTRAVENOUS

## 2019-06-05 MED ORDER — ONDANSETRON HCL 4 MG/2ML IJ SOLN
INTRAMUSCULAR | Status: AC
  Filled 2019-06-05: qty 2

## 2019-06-05 MED ORDER — OXYCODONE HCL 5 MG/5ML PO SOLN: 5.0000 mg | Freq: Once | ORAL | Status: DC | PRN

## 2019-06-05 MED ORDER — LIDOCAINE-EPINEPHRINE 1 %-1:100000 IJ SOLN
INTRAMUSCULAR | Status: AC
  Filled 2019-06-05: qty 1

## 2019-06-05 MED ORDER — SUGAMMADEX SODIUM 200 MG/2ML IV SOLN
INTRAVENOUS | Status: DC | PRN
  Administered 2019-06-05: 200 mg via INTRAVENOUS

## 2019-06-05 MED ORDER — POLYETHYLENE GLYCOL 3350 17 GM/SCOOP PO POWD
ORAL | Status: AC
Start: 2019-06-05 — End: ?

## 2019-06-05 MED ORDER — ONDANSETRON HCL 4 MG/2ML IJ SOLN
INTRAMUSCULAR | Status: DC | PRN
  Administered 2019-06-05: 4 mg via INTRAVENOUS

## 2019-06-05 MED ORDER — ONDANSETRON HCL 4 MG/2ML IJ SOLN: 4.0000 mg | Freq: Once | INTRAMUSCULAR | Status: DC | PRN

## 2019-06-05 MED ORDER — OXYCODONE HCL 5 MG PO TABS
5.0000 mg | ORAL_TABLET | Freq: Once | ORAL | Status: DC | PRN
Start: 2019-06-05 — End: 2019-06-05

## 2019-06-05 MED ORDER — FENTANYL CITRATE (PF) 250 MCG/5ML IJ SOLN
INTRAMUSCULAR | Status: AC
  Filled 2019-06-05: qty 5

## 2019-06-05 MED ORDER — HEMOSTATIC AGENTS (NO CHARGE) OPTIME
TOPICAL | Status: DC | PRN
  Administered 2019-06-05: 1 via TOPICAL

## 2019-06-05 MED ORDER — SODIUM CHLORIDE 0.9 % IR SOLN
Status: DC | PRN
  Administered 2019-06-05: 1000 mL

## 2019-06-05 MED ORDER — LIDOCAINE-EPINEPHRINE 1 %-1:100000 IJ SOLN
INTRAMUSCULAR | Status: DC | PRN
  Administered 2019-06-05: 20 mL via INTRAMUSCULAR

## 2019-06-05 MED ORDER — PROPOFOL 10 MG/ML IV BOLUS
INTRAVENOUS | Status: DC | PRN
  Administered 2019-06-05: 160 mg via INTRAVENOUS

## 2019-06-05 MED ORDER — IBUPROFEN 800 MG PO TABS: 800.0000 mg | ORAL_TABLET | Freq: Three times a day (TID) | ORAL | 0 refills | Status: AC | PRN

## 2019-06-05 MED ORDER — DEXAMETHASONE SODIUM PHOSPHATE 10 MG/ML IJ SOLN
INTRAMUSCULAR | Status: DC | PRN
  Administered 2019-06-05: 5 mg via INTRAVENOUS

## 2019-06-05 MED ORDER — PROPOFOL 10 MG/ML IV BOLUS
INTRAVENOUS | Status: AC
  Filled 2019-06-05: qty 20

## 2019-06-05 MED ORDER — DEXAMETHASONE SODIUM PHOSPHATE 10 MG/ML IJ SOLN
INTRAMUSCULAR | Status: AC
  Filled 2019-06-05: qty 1

## 2019-06-05 MED ORDER — ACETAMINOPHEN 500 MG PO TABS: ORAL_TABLET | ORAL | 0 refills | Status: AC

## 2019-06-05 MED ORDER — ROCURONIUM BROMIDE 10 MG/ML (PF) SYRINGE
PREFILLED_SYRINGE | INTRAVENOUS | Status: DC | PRN
  Administered 2019-06-05: 60 mg via INTRAVENOUS

## 2019-06-05 MED ORDER — FENTANYL CITRATE (PF) 250 MCG/5ML IJ SOLN
INTRAMUSCULAR | Status: DC | PRN
  Administered 2019-06-05: 100 ug via INTRAVENOUS

## 2019-06-05 MED ORDER — ROCURONIUM BROMIDE 10 MG/ML (PF) SYRINGE
PREFILLED_SYRINGE | INTRAVENOUS | Status: AC
  Filled 2019-06-05: qty 10

## 2019-06-05 MED ORDER — MIDAZOLAM HCL 2 MG/2ML IJ SOLN
INTRAMUSCULAR | Status: AC
  Filled 2019-06-05: qty 2

## 2019-06-05 MED ORDER — TRAMADOL HCL 50 MG PO TABS: 50.0000 mg | ORAL_TABLET | Freq: Four times a day (QID) | ORAL | 0 refills | Status: AC | PRN

## 2019-06-05 MED ORDER — LEVOTHYROXINE SODIUM 100 MCG PO TABS: 100.0000 ug | ORAL_TABLET | Freq: Every day | ORAL | Status: DC

## 2019-06-05 MED ORDER — 0.9 % SODIUM CHLORIDE (POUR BTL) OPTIME
TOPICAL | Status: DC | PRN
  Administered 2019-06-05: 1000 mL

## 2019-06-05 MED ORDER — METHOCARBAMOL 500 MG PO TABS: 500.0000 mg | ORAL_TABLET | Freq: Three times a day (TID) | ORAL | 0 refills | Status: AC | PRN

## 2019-06-05 MED ORDER — THYROID 90 MG PO TABS: 90.0000 mg | ORAL_TABLET | Freq: Every day | ORAL | 0 refills | Status: AC

## 2019-06-05 MED ORDER — DIPHENHYDRAMINE HCL 50 MG/ML IJ SOLN
INTRAMUSCULAR | Status: DC | PRN
  Administered 2019-06-05: 12.5 mg via INTRAVENOUS

## 2019-06-05 MED ORDER — LIDOCAINE 2% (20 MG/ML) 5 ML SYRINGE
INTRAMUSCULAR | Status: DC | PRN
  Administered 2019-06-05: 40 mg via INTRAVENOUS

## 2019-06-05 MED ORDER — ONDANSETRON HCL 4 MG PO TABS: 4.0000 mg | ORAL_TABLET | Freq: Three times a day (TID) | ORAL | 0 refills | Status: AC | PRN

## 2019-06-05 MED ORDER — OXYCODONE HCL 5 MG PO TABS: 5.0000 mg | ORAL_TABLET | ORAL | 0 refills | Status: DC | PRN

## 2019-06-05 MED ORDER — DIPHENHYDRAMINE HCL 50 MG/ML IJ SOLN
INTRAMUSCULAR | Status: AC
  Filled 2019-06-05: qty 1

## 2019-06-05 MED ORDER — ENOXAPARIN SODIUM 40 MG/0.4ML ~~LOC~~ SOLN: 40.0000 mg | SUBCUTANEOUS | Status: DC

## 2019-06-05 SURGICAL SUPPLY — 46 items
ADH SKN CLS APL DERMABOND .7 (GAUZE/BANDAGES/DRESSINGS) ×1
APL PRP STRL LF DISP 70% ISPRP (MISCELLANEOUS) ×1
APPLIER CLIP 5 13 M/L LIGAMAX5 (MISCELLANEOUS) ×3
APR CLP MED LRG 5 ANG JAW (MISCELLANEOUS) ×1
BAG SPEC RTRVL LRG 6X4 10 (ENDOMECHANICALS) ×1
BLADE CLIPPER SURG (BLADE) IMPLANT
CANISTER SUCT 3000ML PPV (MISCELLANEOUS) ×3 IMPLANT
CHLORAPREP W/TINT 26 (MISCELLANEOUS) ×3 IMPLANT
CLIP APPLIE 5 13 M/L LIGAMAX5 (MISCELLANEOUS) ×1 IMPLANT
COVER SURGICAL LIGHT HANDLE (MISCELLANEOUS) ×3 IMPLANT
CUTTER FLEX LINEAR 45M (STAPLE) ×2 IMPLANT
DERMABOND ADVANCED (GAUZE/BANDAGES/DRESSINGS) ×2
DERMABOND ADVANCED .7 DNX12 (GAUZE/BANDAGES/DRESSINGS) ×1 IMPLANT
DISSECTOR BLUNT TIP ENDO 5MM (MISCELLANEOUS) IMPLANT
ELECT CAUTERY BLADE 6.4 (BLADE) ×3 IMPLANT
ELECT REM PT RETURN 9FT ADLT (ELECTROSURGICAL) ×3
ELECTRODE REM PT RTRN 9FT ADLT (ELECTROSURGICAL) ×1 IMPLANT
GLOVE BIO SURGEON STRL SZ 6.5 (GLOVE) ×2 IMPLANT
GLOVE BIO SURGEONS STRL SZ 6.5 (GLOVE) ×1
GLOVE BIOGEL PI IND STRL 6 (GLOVE) ×1 IMPLANT
GLOVE BIOGEL PI INDICATOR 6 (GLOVE) ×2
GOWN STRL REUS W/ TWL LRG LVL3 (GOWN DISPOSABLE) ×3 IMPLANT
GOWN STRL REUS W/TWL LRG LVL3 (GOWN DISPOSABLE) ×9
HEMOSTAT SNOW SURGICEL 2X4 (HEMOSTASIS) ×2 IMPLANT
KIT BASIN OR (CUSTOM PROCEDURE TRAY) ×3 IMPLANT
KIT TURNOVER KIT B (KITS) ×3 IMPLANT
NS IRRIG 1000ML POUR BTL (IV SOLUTION) ×3 IMPLANT
PAD ARMBOARD 7.5X6 YLW CONV (MISCELLANEOUS) ×3 IMPLANT
PENCIL BUTTON HOLSTER BLD 10FT (ELECTRODE) ×3 IMPLANT
POUCH SPECIMEN RETRIEVAL 10MM (ENDOMECHANICALS) ×3 IMPLANT
RELOAD STAPLE 45 3.5 BLU ETS (ENDOMECHANICALS) IMPLANT
RELOAD STAPLE TA45 3.5 REG BLU (ENDOMECHANICALS) ×3 IMPLANT
SCISSORS LAP 5X35 DISP (ENDOMECHANICALS) ×3 IMPLANT
SET IRRIG TUBING LAPAROSCOPIC (IRRIGATION / IRRIGATOR) ×3 IMPLANT
SET TUBE SMOKE EVAC HIGH FLOW (TUBING) ×3 IMPLANT
SLEEVE ENDOPATH XCEL 5M (ENDOMECHANICALS) ×6 IMPLANT
SPECIMEN JAR SMALL (MISCELLANEOUS) ×3 IMPLANT
SUT MNCRL AB 4-0 PS2 18 (SUTURE) ×5 IMPLANT
SUT VICRYL 0 AB UR-6 (SUTURE) IMPLANT
TOWEL GREEN STERILE (TOWEL DISPOSABLE) ×3 IMPLANT
TOWEL GREEN STERILE FF (TOWEL DISPOSABLE) ×3 IMPLANT
TRAY LAPAROSCOPIC MC (CUSTOM PROCEDURE TRAY) ×3 IMPLANT
TROCAR BLADELESS 12MM (ENDOMECHANICALS) ×2 IMPLANT
TROCAR XCEL BLUNT TIP 100MML (ENDOMECHANICALS) ×5 IMPLANT
TROCAR XCEL NON-BLD 5MMX100MML (ENDOMECHANICALS) ×3 IMPLANT
WATER STERILE IRR 1000ML POUR (IV SOLUTION) ×3 IMPLANT

## 2019-06-05 NOTE — Anesthesia Preprocedure Evaluation (Signed)
Anesthesia Evaluation  Patient identified by MRN, date of birth, ID band Patient awake    Reviewed: Allergy & Precautions, NPO status , Patient's Chart, lab work & pertinent test results  Airway Mallampati: II  TM Distance: >3 FB Neck ROM: Full    Dental  (+) Teeth Intact, Dental Advisory Given   Pulmonary    breath sounds clear to auscultation       Cardiovascular  Rhythm:Regular Rate:Normal     Neuro/Psych    GI/Hepatic   Endo/Other    Renal/GU      Musculoskeletal   Abdominal   Peds  Hematology   Anesthesia Other Findings   Reproductive/Obstetrics                             Anesthesia Physical Anesthesia Plan  ASA: II  Anesthesia Plan: General   Post-op Pain Management:    Induction: Intravenous, Rapid sequence and Cricoid pressure planned  PONV Risk Score and Plan: Dexamethasone and Ondansetron  Airway Management Planned: Oral ETT  Additional Equipment:   Intra-op Plan:   Post-operative Plan: Extubation in OR  Informed Consent: I have reviewed the patients History and Physical, chart, labs and discussed the procedure including the risks, benefits and alternatives for the proposed anesthesia with the patient or authorized representative who has indicated his/her understanding and acceptance.     Dental advisory given  Plan Discussed with: CRNA and Anesthesiologist  Anesthesia Plan Comments:         Anesthesia Quick Evaluation

## 2019-06-05 NOTE — Progress Notes (Signed)
IV will be started in the OR per RN

## 2019-06-05 NOTE — Anesthesia Postprocedure Evaluation (Signed)
Anesthesia Post Note  Patient: Lindsay Lewis  Procedure(s) Performed: LAPAROSCOPIC CHOLECYSTECTOMY (N/A Abdomen)     Patient location during evaluation: PACU Anesthesia Type: General Level of consciousness: awake and alert Pain management: pain level controlled Vital Signs Assessment: post-procedure vital signs reviewed and stable Respiratory status: spontaneous breathing, nonlabored ventilation, respiratory function stable and patient connected to nasal cannula oxygen Cardiovascular status: blood pressure returned to baseline and stable Postop Assessment: no apparent nausea or vomiting Anesthetic complications: no    Last Vitals:  Vitals:   06/05/19 1215 06/05/19 1439  BP: 110/74 105/73  Pulse: 63 64  Resp: 18 18  Temp: 37 C 36.4 C  SpO2: 98% 100%    Last Pain:  Vitals:   06/05/19 1439  TempSrc: Oral  PainSc:                  Rachana Malesky COKER

## 2019-06-05 NOTE — Transfer of Care (Signed)
Immediate Anesthesia Transfer of Care Note  Patient: KORRI ASK  Procedure(s) Performed: LAPAROSCOPIC CHOLECYSTECTOMY (N/A Abdomen)  Patient Location: PACU  Anesthesia Type:General  Level of Consciousness: drowsy and patient cooperative  Airway & Oxygen Therapy: Patient Spontanous Breathing  Post-op Assessment: Report given to RN and Post -op Vital signs reviewed and stable  Post vital signs: Reviewed and stable  Last Vitals:  Vitals Value Taken Time  BP 186/143 06/05/19 1110  Temp    Pulse 102 06/05/19 1108  Resp 11 06/05/19 1110  SpO2 94 % 06/05/19 1108  Vitals shown include unvalidated device data.  Last Pain:  Vitals:   06/05/19 0830  TempSrc:   PainSc: 0-No pain         Complications: No apparent anesthesia complications

## 2019-06-05 NOTE — Op Note (Signed)
   Operative Note  Date: 06/05/2019  Procedure: laparoscopic subtotal cholecystectomy  Pre-op diagnosis: biliary colic Post-op diagnosis: Chronic calculous cholecystitis  Indication and clinical history: The patient is a 44 y.o. year old female with a presentation consistent with biliary colic.  Surgeon: Diamantina Monks, MD Assistant: Janee Morn, MD  Anesthesia: General  Findings:  Specimen: gallbladder EBL: <10cc Drains/Implants: none  Disposition: PACU - hemodynamically stable.  Description of procedure: The patient was positioned supine on the operating room table. Time-out was performed verifying correct patient, procedure, signature of informed consent, and administration of pre-operative antibiotics. General anesthetic induction and intubation were uneventful. The abdomen was prepped and draped in the usual sterile fashion. An infra-umbilical incision was made using an open technique using zero vicryl stay sutures on either side of the fascia and a 67mm Hassan port inserted. After establishing pneumoperitoneum, which the patient tolerated well, the abdominal cavity was inspected and no injury of any intra-abdominal structures was identified. Additional ports were placed under direct visualization and using local anesthetic: two 68mm ports in the right subcostal region and a 60mm port in the epigastric region. The patient was re-positioned to reverse Trendelenburg and right side up. Adhesiolysis was performed to expose the gallbladder, which was then retracted cephalad. Dissection began at the base of the gallbladder, identifying a tubular structure at its lateral edge entering the gallbladder. This was doubly clipped and divided. With further dissection, the infundibulum was unable to clearly identified. The base of the gallbladder was retracted laterally and the circumferentially exposed with the liver visible behind it. It was unclear whether this structure was the base of the gallbladder  or was an enlarged cystic duct, so the decision was made to transect at this level using and endo-GIA stapler under the presumption that this would be a subtotal cholecystectomy. The epigastric port site was upsized to a 49mm port  accommodate the stapler. The gallbladder was dissected off the liver bed using electrocautery and hemostasis of the liver bed was confirmed. The gallbladder fossa was irrigated and fluid returned clear. After transection of the final peritoneal attachments, the gallbladder was placed in an endoscopic specimen retrieval bag, removed via the umbilical port site, and sent to pathology as a specimen. The gallbladder fossa was inspected confirming hemostasis, the absence of bile leakage from the staple line, and correct placement of clips on the cystic artery. The abdomen was desufflated and the fascia of the umbilical port site was closed using the previously placed stay sutures. Additional local anesthetic was administered at the umbilical port site.  The skin of all incisions was closed with 4-0 monocryl. Sterile dressings were applied. All sponge and instrument counts were correct at the conclusion of the procedure. The patient was awakened from anesthesia, extubated uneventfully, and transported to the PACU - hemodynamically stable.. There were no complications.   Patient and husband notified of presumed subtotal cholecystectomy and the plan for LFTs prior to next clinic appointment.   Diamantina Monks, MD General and Trauma Surgery [facility]

## 2019-06-05 NOTE — Progress Notes (Signed)
Discharged home with husband.Personal belongings,discharged instructions given to patient.Advised to pick up medications called in to pharmacy of choice.Verbalized understanding of instructions

## 2019-06-05 NOTE — Anesthesia Procedure Notes (Signed)
Procedure Name: Intubation Date/Time: 06/05/2019 9:36 AM Performed by: Modena Morrow, CRNA Pre-anesthesia Checklist: Patient identified, Emergency Drugs available, Suction available and Patient being monitored Patient Re-evaluated:Patient Re-evaluated prior to induction Oxygen Delivery Method: Circle system utilized Preoxygenation: Pre-oxygenation with 100% oxygen Induction Type: IV induction Ventilation: Mask ventilation without difficulty Laryngoscope Size: Miller and 2 Grade View: Grade II Tube type: Oral Tube size: 7.0 mm Number of attempts: 1 Airway Equipment and Method: Stylet and Oral airway Placement Confirmation: ETT inserted through vocal cords under direct vision,  positive ETCO2 and breath sounds checked- equal and bilateral Secured at: 21 cm Tube secured with: Tape Dental Injury: Teeth and Oropharynx as per pre-operative assessment

## 2019-06-05 NOTE — Progress Notes (Signed)
   06/05/19 0037  Assess: MEWS Score  Temp 98.6 F (37 C)  BP (!) 101/56  Pulse Rate (!) 50  Resp 19  SpO2 100 %  O2 Device Room Air  Assess: MEWS Score  MEWS Temp 0  MEWS Systolic 0  MEWS Pulse 1  MEWS RR 0  MEWS LOC 0  MEWS Score 1  MEWS Score Color Green  VS rechecked for patient being a yellow MEWS. Upon recheck patient is green. No need for interventions at this time.

## 2019-06-05 NOTE — Progress Notes (Signed)
Day of Surgery    CC: Abdominal pain  Subjective: Patient says she does not have any pain this AM.  She points to her right upper quadrant as the side of pain, and going to her right flank when it was present.  She had a PCP treating her for hypothyroidism.  That physician or practice has subsequently left the area and she has not obtained a replacement PCP.  Objective: Vital signs in last 24 hours: Temp:  [97.9 F (36.6 C)-98.6 F (37 C)] 97.9 F (36.6 C) (05/18 0536) Pulse Rate:  [48-99] 51 (05/18 0536) Resp:  [11-19] 16 (05/18 0536) BP: (95-117)/(29-85) 105/61 (05/18 0536) SpO2:  [92 %-100 %] 96 % (05/18 0536)    Intake/Output from previous day: 05/17 0701 - 05/18 0700 In: 2100 [IV Piggyback:2100] Out: -  Intake/Output this shift: No intake/output data recorded.  General appearance: alert, cooperative, no distress and She has no pain this AM. Resp: clear to auscultation bilaterally GI: Overweight, no pain this AM.  Lab Results:  Recent Labs    06/04/19 0450  WBC 6.5  HGB 13.5  HCT 41.4  PLT 249    BMET Recent Labs    06/04/19 0450  NA 139  K 4.0  CL 104  CO2 25  GLUCOSE 130*  BUN 19  CREATININE 1.26*  CALCIUM 9.6   PT/INR Recent Labs    06/04/19 1232  LABPROT 12.5  INR 1.0    Recent Labs  Lab 06/04/19 0450  AST 21  ALT 27  ALKPHOS 67  BILITOT 0.4  PROT 7.3  ALBUMIN 4.1     Lipase     Component Value Date/Time   LIPASE 42 06/04/2019 0450     Medications: . acetaminophen  1,000 mg Oral Q6H  . enoxaparin (LOVENOX) injection  40 mg Subcutaneous Q24H   .  ceFAZolin (ANCEF) IV    . methocarbamol (ROBAXIN) IV 1,000 mg (06/05/19 0615)    Assessment/Plan Hypothyroid-off supplement -TSH 58.8/T3 1.3/T4 0.36 CKD -creatinine 1.26  Biliary colic Cholelithiasis with 1.2 cm calculus within the gallbladder neck  FEN: N.p.o./adding IV fluids ID: Ancef preop DVT: SCDs Follow-up: [facility] clinic/PCP   Plan:  For surgery today, she had a PCP  and physician left and she has not had found another doctor to follow up with.      LOS: 0 days    Liesa Tsan 06/05/2019 Please see Amion

## 2019-06-05 NOTE — Discharge Summary (Signed)
Physician Discharge Summary  Patient ID: Lindsay Lewis MRN: [id number] DOB/AGE: 1975-12-07 44 y.o.  Admit date: 06/04/2019 Discharge date: 06/05/2019  Admission Diagnoses:  Biliary colic Hypothyroid -off treatment BMI 33.2  Discharge Diagnoses:  Biliary colic Cholelithiasis with 1.2 cm callus gallbladder neck Hypothyroid -off supplement CKD BMI 33.2    Active Problems:   Biliary colic   PROCEDURES: Laparoscopic subtotal cholecystectomy, 06/05/2019 Dr. Montel Culver [facility] Course:    [phone] with acute onset of abdominal pain that began approximately 12 hours ago, associated with non-bloody, non-bilious emesis. She denies every having had pain like this before. She localizes the pain to the epigastrium with radiation to the back. She reports normal bowel movements, most recently on 06/02/2019. Last meal 1800 on 06/03/2019, had hot wings and chocolate milk. She tried ibuprofen at home without relief. No alleviating factors. She reports a prior appendectomy approximately 17y ago.  History reviewed. No pertinent past medical history. Thyroid disease, non-adherent to thyroid supplementation, last taken ~1 month ago. History of renal disease and liver disease. Denies having a nephrologist or gastroenterologist/hepatologist. Unknown baseline creatinine. Unknown etiology of "liver problems". Patient was seen in the emergency department and admitted by Dr. Bobbye Morton.  She was taken to the operating room the following a.m. and underwent laparoscopic subtotal cholecystectomy.  She tolerated the procedure well.  After in-depth discussion with Dr. Bobbye Morton she agreed to resume her thyroid supplement.  She was anxious for discharge and was discharged postoperative evening.  She is to follow-up with primary care or endocrinologist for her hypothyroidism.  We discussed postoperative care, postoperative medicines, prior to discharge.  Condition on discharge: Improved  CBC Latest Ref Rng & Units  06/04/2019  WBC 4.0 - 10.5 K/uL 6.5  Hemoglobin 12.0 - 15.0 g/dL 13.5  Hematocrit 36.0 - 46.0 % 41.4  Platelets 150 - 400 K/uL 249    CMP Latest Ref Rng & Units 06/04/2019  Glucose 70 - 99 mg/dL 130(H)  BUN 6 - 20 mg/dL 19  Creatinine 0.44 - 1.00 mg/dL 1.26(H)  Sodium 135 - 145 mmol/L 139  Potassium 3.5 - 5.1 mmol/L 4.0  Chloride 98 - 111 mmol/L 104  CO2 22 - 32 mmol/L 25  Calcium 8.9 - 10.3 mg/dL 9.6  Total Protein 6.5 - 8.1 g/dL 7.3  Total Bilirubin 0.3 - 1.2 mg/dL 0.4  Alkaline Phos 38 - 126 U/L 67  AST 15 - 41 U/L 21  ALT 0 - 44 U/L 27   TSH       58.889   TSH     Triiodothyronine,Free,Serum       1.3   Triiodothyronine,Free,Serum    T4,Free(Direct)       0.36       HIV is nonreactive, SARS coronavirus was also negative.   Condition on discharge: Improved  Disposition: Discharge disposition: 01-Home or Self Care        Allergies as of 06/05/2019   No Known Allergies     Medication List    TAKE these medications   acetaminophen 500 MG tablet Commonly known as: TYLENOL You can take 1000 mg of Tylenol/acetaminophen every 8 hours as needed for pain.  Do not take more than 4000 mg of Tylenol/acetaminophen per day it can harm your liver.  You can buy this at any drug store over the counter.   ibuprofen 800 MG tablet Commonly known as: ADVIL Take 1 tablet (800 mg total) by mouth every 8 (eight) hours as needed. What changed:   medication  strength  how much to take  when to take this  reasons to take this   methocarbamol 500 MG tablet Commonly known as: ROBAXIN Take 1 tablet (500 mg total) by mouth every 8 (eight) hours as needed (use for muscle cramps/pain).   ondansetron 4 MG tablet Commonly known as: ZOFRAN Take 1 tablet (4 mg total) by mouth every 8 (eight) hours as needed for nausea or vomiting.   polyethylene glycol powder 17 GM/SCOOP powder Commonly known as: MiraLax To correct constipation. Follow container instructions.  Your aim is to have  1-2 soft bowel movements per day.  You can buy this over the counter at any drug store.   thyroid 90 MG tablet Commonly known as: Armour Thyroid Take 1 tablet (90 mg total) by mouth daily.   traMADol 50 MG tablet Commonly known as: ULTRAM Take 1-2 tablets (50-100 mg total) by mouth every 6 (six) hours as needed for severe pain.      Follow-up Information    [facility], [place]. Call on 06/26/2019.   Specialty: General Surgery Why: Your appointment is at 10:15 AM.  Be at the office 30 minutes early for check in.  Designer, fashion/clothing ID and Insurance account manager information: [address] [place] [address] [phone]       Primary care Doctor Follow up.   Why: Contact your primary care physician and follow up on your medical issues; especially your hypothyroid condition.  We will not refill the prescription for this.           SignedSherrie George 06/05/2019, 5:16 PM

## 2019-06-06 LAB — SURGICAL PATHOLOGY

## 2021-12-22 IMAGING — US US ABDOMEN LIMITED
1 series · 14 of 25 positions shown · non-contrast
Comparison: Abdominal ultrasound 01/16/2016.

CLINICAL DATA: Right upper quadrant abdominal pain. Additional
history provided by scanning technologist: Right upper quadrant pain
since last night.

EXAM:
ULTRASOUND ABDOMEN LIMITED RIGHT UPPER QUADRANT

[Series 1: us abdomen limited ruq · 14 of 40 slices shown]
[im 1/40]
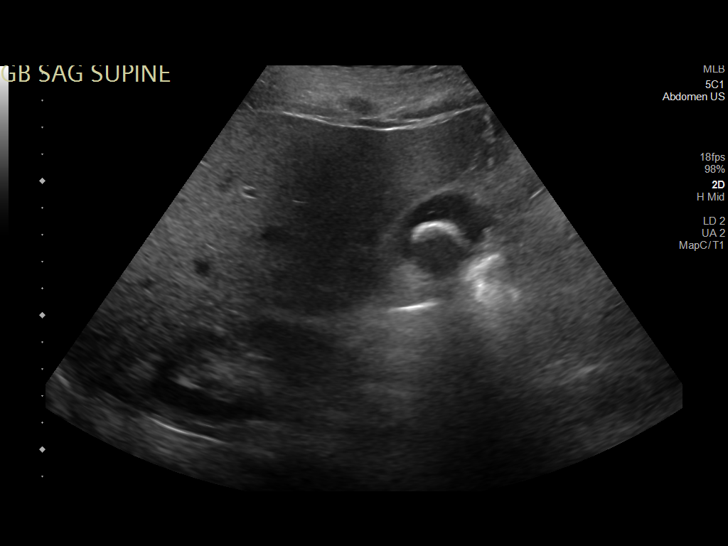
[im 4/40]
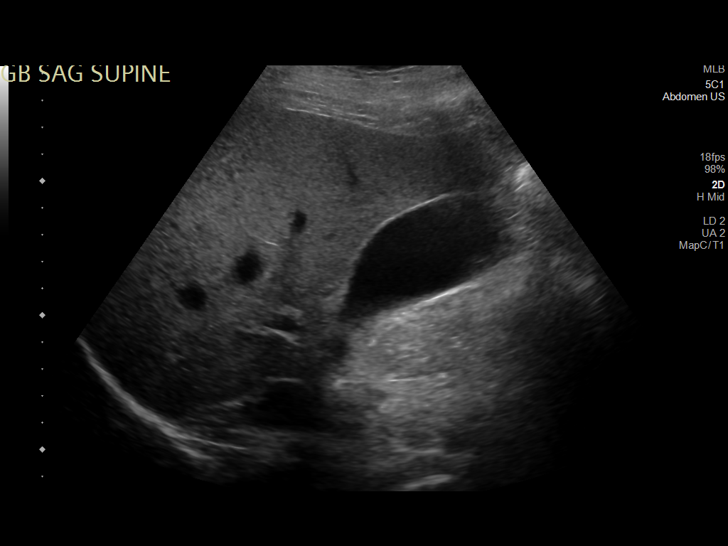
[im 7/40]
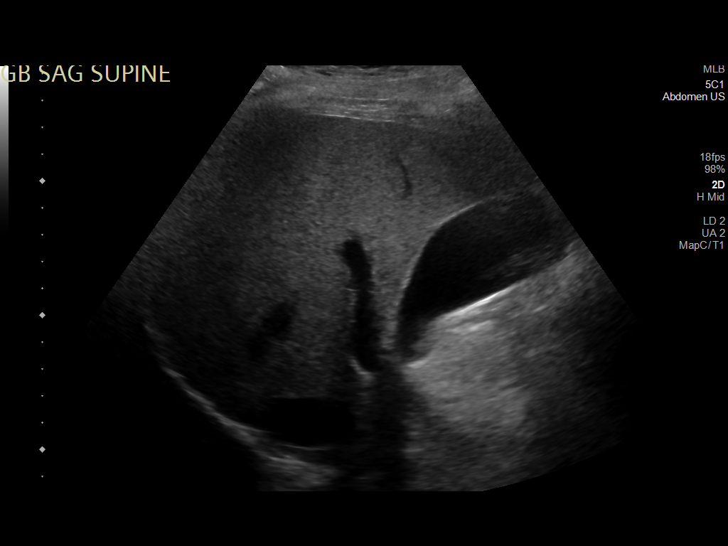
[im 10/40]
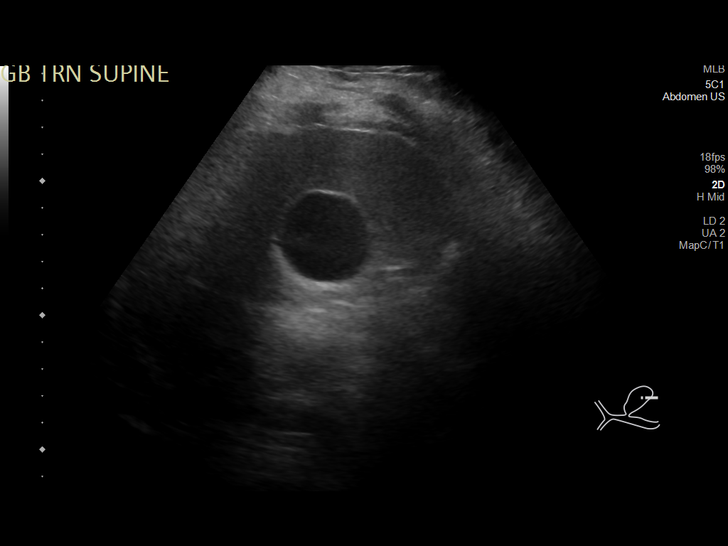
[im 14/40]
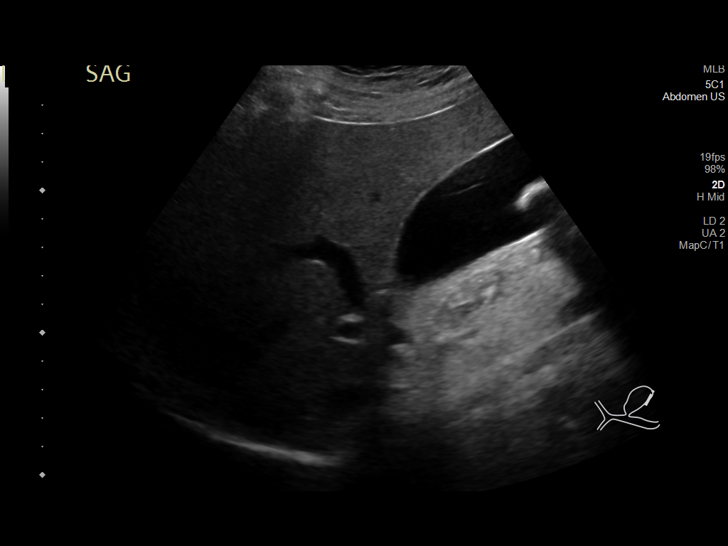
[im 15/40]
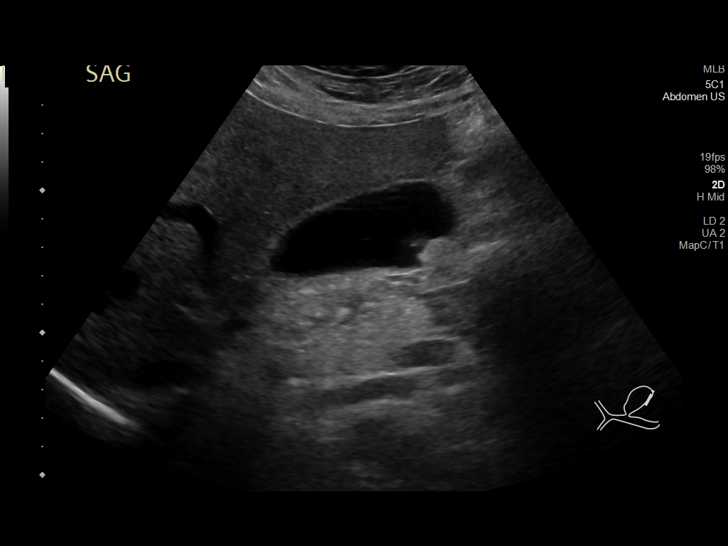
[im 18/40]
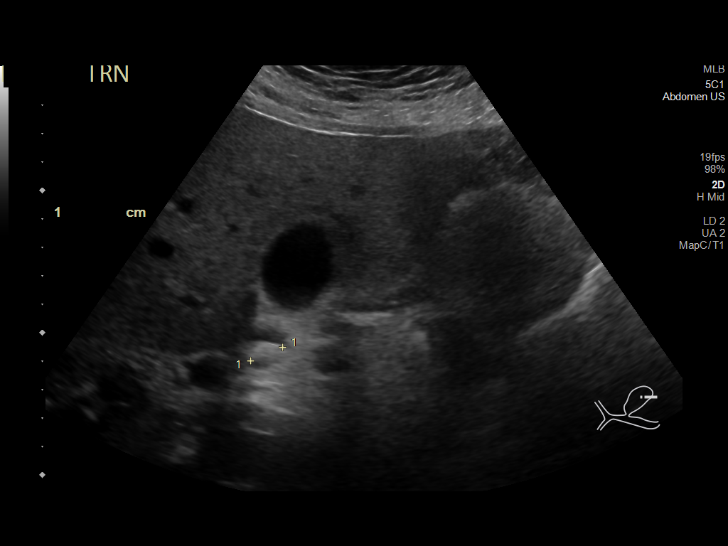
[im 22/40]
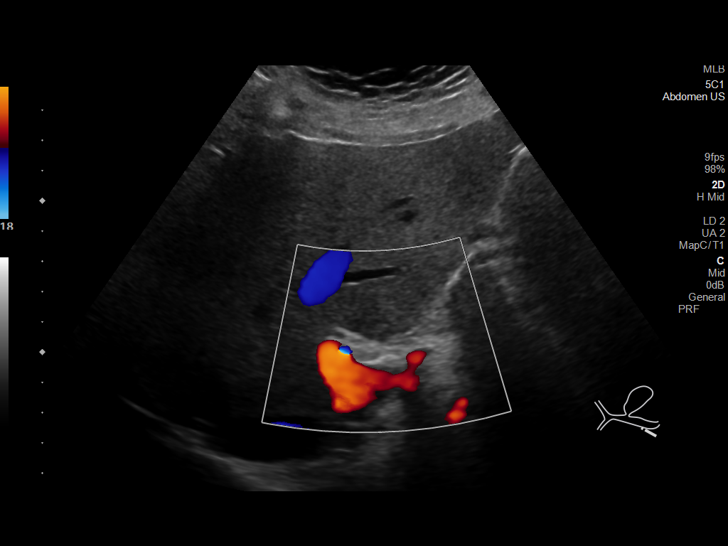
[im 25/40]
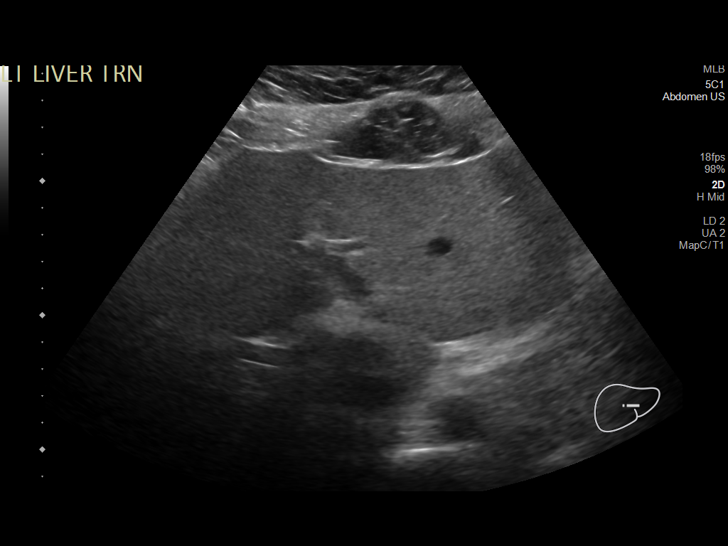
[im 27/40]
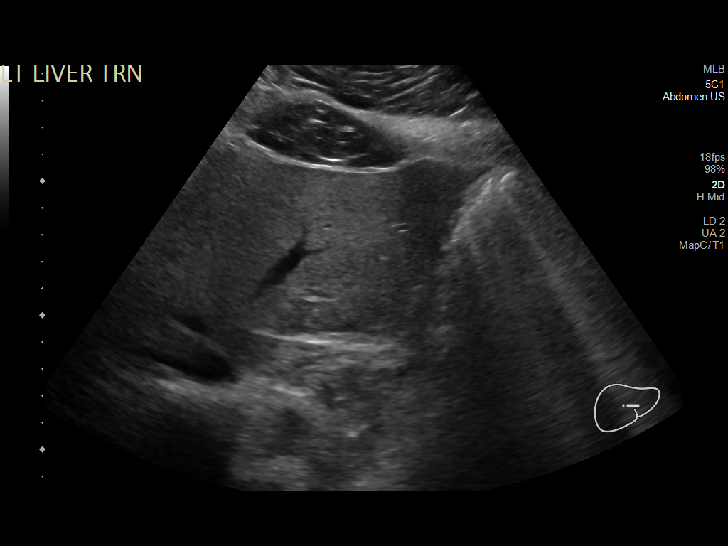
[im 30/40]
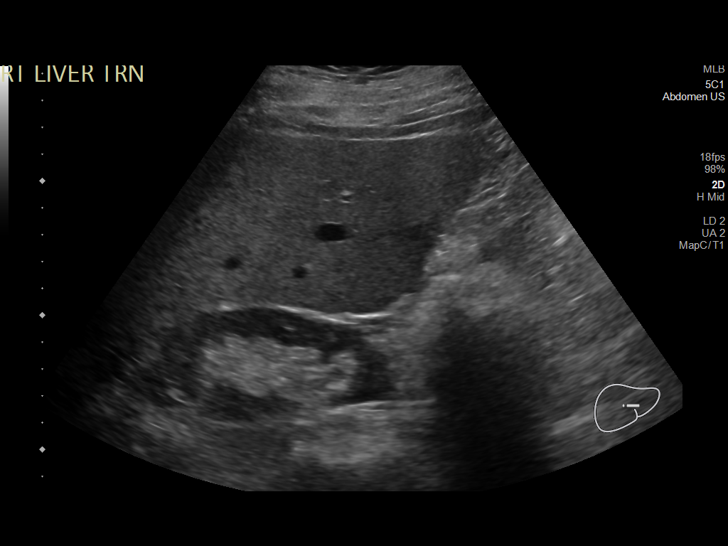
[im 33/40]
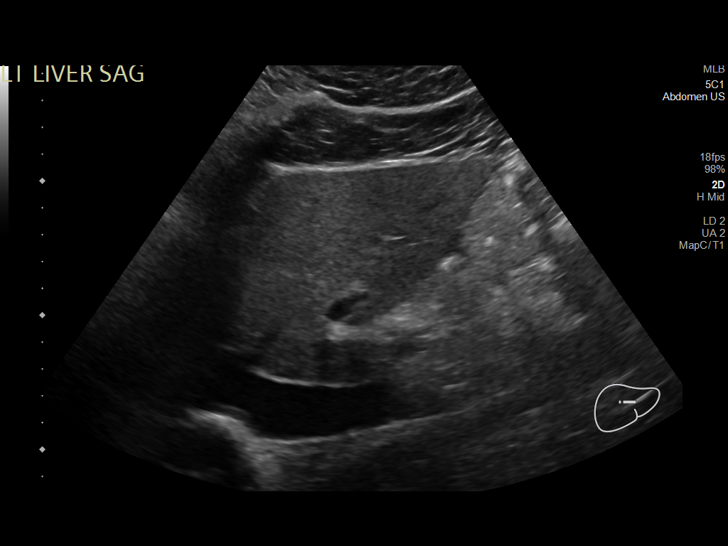
[im 36/40]
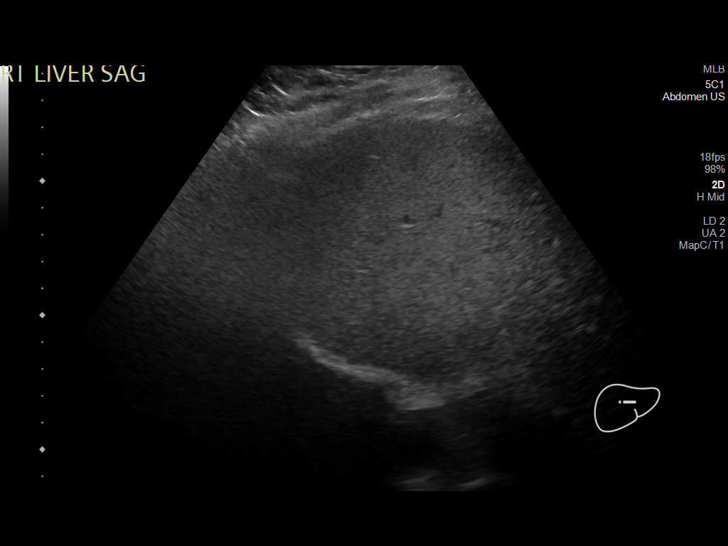
[im 40/40]
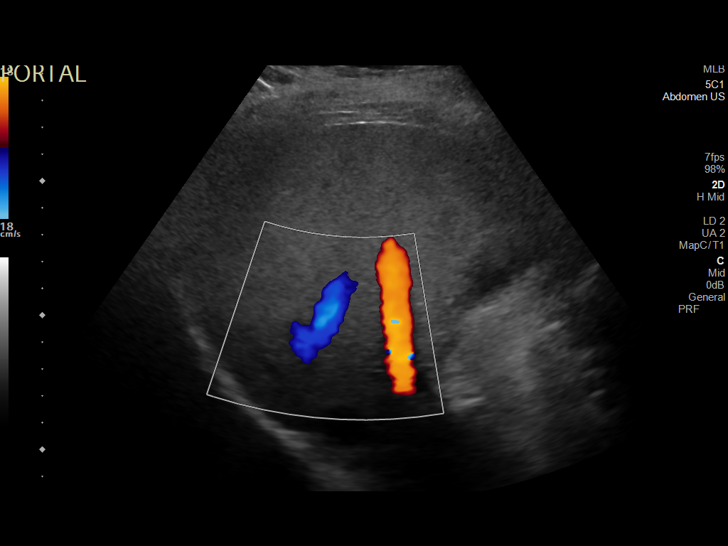

[14 of 25 positions shown; findings below may reference images not displayed]

FINDINGS: Gallbladder:

Cholecystolithiasis. This includes a 1.2 cm calculus within the
gallbladder neck. No gallbladder wall thickening. No sonographic
Murphy sign noted by sonographer.

Common bile duct:

Diameter: 3-4 mm, within normal limits.

Liver:

No focal lesion identified. Generalized increased hepatic
parenchymal echogenicity. Portal vein is patent on color Doppler
imaging with normal direction of blood flow towards the liver.
IMPRESSION: Cholecystolithiasis including a 1.2 cm calculus within the
gallbladder neck. No sonographic evidence of acute cholecystitis.
The visualized common duct is normal in caliber

Hyperechogenicity of the hepatic parenchyma. This is a nonspecific
finding, which may be seen in the setting of hepatic steatosis or
other chronic hepatic parenchymal disease.
